# Patient Record
Sex: Male | Born: 1945 | Race: White | Hispanic: No
Health system: Southern US, Community
[De-identification: ages and names within clinical notes are randomized; demographics above are authoritative.]

## PROBLEM LIST (undated history)

## (undated) DIAGNOSIS — E876 Hypokalemia: Secondary | ICD-10-CM

## (undated) DIAGNOSIS — C329 Malignant neoplasm of larynx, unspecified: Secondary | ICD-10-CM

## (undated) DIAGNOSIS — D649 Anemia, unspecified: Secondary | ICD-10-CM

## (undated) DIAGNOSIS — E038 Other specified hypothyroidism: Secondary | ICD-10-CM

## (undated) DIAGNOSIS — I951 Orthostatic hypotension: Secondary | ICD-10-CM

## (undated) DIAGNOSIS — C189 Malignant neoplasm of colon, unspecified: Secondary | ICD-10-CM

## (undated) DIAGNOSIS — E785 Hyperlipidemia, unspecified: Secondary | ICD-10-CM

## (undated) HISTORY — DX: Orthostatic hypotension: I95.1

## (undated) HISTORY — DX: Anemia, unspecified: D64.9

## (undated) HISTORY — DX: Other specified hypothyroidism: E03.8

## (undated) HISTORY — DX: Hyperlipidemia, unspecified: E78.5

## (undated) HISTORY — DX: Hypokalemia: E87.6

---

## 1993-01-09 DIAGNOSIS — C189 Malignant neoplasm of colon, unspecified: Secondary | ICD-10-CM

## 1993-01-09 HISTORY — DX: Malignant neoplasm of colon, unspecified: C18.9

## 1997-11-30 ENCOUNTER — Ambulatory Visit (HOSPITAL_BASED_OUTPATIENT_CLINIC_OR_DEPARTMENT_OTHER): Admission: RE | Admit: 1997-11-30 | Discharge: 1997-11-30 | Payer: Self-pay | Admitting: Otolaryngology

## 1997-12-16 ENCOUNTER — Ambulatory Visit (HOSPITAL_BASED_OUTPATIENT_CLINIC_OR_DEPARTMENT_OTHER): Admission: RE | Admit: 1997-12-16 | Discharge: 1997-12-16 | Payer: Self-pay | Admitting: Otolaryngology

## 2006-01-12 ENCOUNTER — Encounter (HOSPITAL_COMMUNITY): Admission: RE | Admit: 2006-01-12 | Discharge: 2006-02-11 | Payer: Self-pay | Admitting: Oncology

## 2006-01-12 ENCOUNTER — Ambulatory Visit (HOSPITAL_COMMUNITY): Payer: Self-pay | Admitting: Oncology

## 2008-07-20 ENCOUNTER — Encounter: Admission: RE | Admit: 2008-07-20 | Discharge: 2008-10-05 | Payer: Self-pay | Admitting: Orthopedic Surgery

## 2008-12-29 ENCOUNTER — Encounter: Admission: RE | Admit: 2008-12-29 | Discharge: 2008-12-29 | Payer: Self-pay | Admitting: Otolaryngology

## 2009-01-11 ENCOUNTER — Ambulatory Visit (HOSPITAL_BASED_OUTPATIENT_CLINIC_OR_DEPARTMENT_OTHER): Admission: RE | Admit: 2009-01-11 | Discharge: 2009-01-11 | Payer: Self-pay | Admitting: Otolaryngology

## 2009-01-14 ENCOUNTER — Ambulatory Visit: Payer: Self-pay | Admitting: Oncology

## 2009-01-26 ENCOUNTER — Ambulatory Visit (HOSPITAL_COMMUNITY): Admission: RE | Admit: 2009-01-26 | Discharge: 2009-01-26 | Payer: Self-pay | Admitting: Otolaryngology

## 2009-02-01 ENCOUNTER — Ambulatory Visit
Admission: RE | Admit: 2009-02-01 | Discharge: 2009-03-25 | Payer: Self-pay | Source: Home / Self Care | Admitting: Radiation Oncology

## 2009-02-02 LAB — CBC WITH DIFFERENTIAL/PLATELET
BASO%: 0.3 % (ref 0.0–2.0)
LYMPH%: 19.4 % (ref 14.0–49.0)
MCHC: 33.6 g/dL (ref 32.0–36.0)
MONO#: 0.8 10*3/uL (ref 0.1–0.9)
RBC: 3.62 10*6/uL — ABNORMAL LOW (ref 4.20–5.82)
WBC: 10.8 10*3/uL — ABNORMAL HIGH (ref 4.0–10.3)
lymph#: 2.1 10*3/uL (ref 0.9–3.3)

## 2009-02-02 LAB — COMPREHENSIVE METABOLIC PANEL
ALT: 16 U/L (ref 0–53)
CO2: 28 mEq/L (ref 19–32)
Chloride: 105 mEq/L (ref 96–112)
Sodium: 141 mEq/L (ref 135–145)
Total Bilirubin: 0.4 mg/dL (ref 0.3–1.2)
Total Protein: 7.2 g/dL (ref 6.0–8.3)

## 2009-02-03 ENCOUNTER — Encounter: Admission: RE | Admit: 2009-02-03 | Discharge: 2009-02-03 | Payer: Self-pay | Admitting: Dentistry

## 2009-02-03 ENCOUNTER — Ambulatory Visit: Payer: Self-pay | Admitting: Dentistry

## 2009-02-05 ENCOUNTER — Ambulatory Visit (HOSPITAL_COMMUNITY): Admission: RE | Admit: 2009-02-05 | Discharge: 2009-02-05 | Payer: Self-pay | Admitting: Radiation Oncology

## 2009-03-08 ENCOUNTER — Encounter
Admission: RE | Admit: 2009-03-08 | Discharge: 2009-06-06 | Payer: Self-pay | Source: Home / Self Care | Admitting: Otolaryngology

## 2009-03-10 ENCOUNTER — Encounter (INDEPENDENT_AMBULATORY_CARE_PROVIDER_SITE_OTHER): Payer: Self-pay | Admitting: Otolaryngology

## 2009-03-10 ENCOUNTER — Inpatient Hospital Stay (HOSPITAL_COMMUNITY): Admission: RE | Admit: 2009-03-10 | Discharge: 2009-04-02 | Payer: Self-pay | Admitting: Otolaryngology

## 2009-03-25 ENCOUNTER — Ambulatory Visit: Payer: Self-pay | Admitting: Internal Medicine

## 2009-03-29 ENCOUNTER — Ambulatory Visit: Payer: Self-pay | Admitting: Oncology

## 2009-04-12 ENCOUNTER — Emergency Department (HOSPITAL_BASED_OUTPATIENT_CLINIC_OR_DEPARTMENT_OTHER): Admission: EM | Admit: 2009-04-12 | Discharge: 2009-04-13 | Payer: Self-pay | Admitting: Emergency Medicine

## 2009-04-13 ENCOUNTER — Ambulatory Visit: Payer: Self-pay | Admitting: Diagnostic Radiology

## 2009-06-01 ENCOUNTER — Emergency Department (HOSPITAL_COMMUNITY)
Admission: EM | Admit: 2009-06-01 | Discharge: 2009-06-01 | Payer: Self-pay | Source: Home / Self Care | Admitting: Emergency Medicine

## 2009-09-27 ENCOUNTER — Encounter: Admission: RE | Admit: 2009-09-27 | Discharge: 2009-09-27 | Payer: Self-pay | Admitting: Otolaryngology

## 2009-11-23 ENCOUNTER — Encounter: Admission: RE | Admit: 2009-11-23 | Discharge: 2009-11-23 | Payer: Self-pay | Admitting: Otolaryngology

## 2009-11-25 ENCOUNTER — Ambulatory Visit (HOSPITAL_COMMUNITY): Admission: RE | Admit: 2009-11-25 | Discharge: 2009-11-25 | Payer: Self-pay | Admitting: Otolaryngology

## 2009-11-25 ENCOUNTER — Ambulatory Visit
Admission: RE | Admit: 2009-11-25 | Discharge: 2010-02-08 | Payer: Self-pay | Source: Home / Self Care | Attending: Radiation Oncology | Admitting: Radiation Oncology

## 2009-11-28 ENCOUNTER — Emergency Department (HOSPITAL_COMMUNITY): Admission: EM | Admit: 2009-11-28 | Discharge: 2009-11-28 | Payer: Self-pay | Admitting: Emergency Medicine

## 2009-12-01 ENCOUNTER — Encounter (INDEPENDENT_AMBULATORY_CARE_PROVIDER_SITE_OTHER): Payer: Self-pay | Admitting: Otolaryngology

## 2009-12-01 ENCOUNTER — Inpatient Hospital Stay (HOSPITAL_COMMUNITY)
Admission: RE | Admit: 2009-12-01 | Discharge: 2009-12-04 | Payer: Self-pay | Source: Home / Self Care | Admitting: Otolaryngology

## 2009-12-10 ENCOUNTER — Encounter: Admission: RE | Admit: 2009-12-10 | Discharge: 2009-12-10 | Payer: Self-pay | Admitting: Otolaryngology

## 2009-12-16 ENCOUNTER — Ambulatory Visit: Payer: Self-pay | Admitting: Oncology

## 2009-12-17 LAB — COMPREHENSIVE METABOLIC PANEL
ALT: 16 U/L (ref 0–53)
AST: 17 U/L (ref 0–37)
CO2: 24 mEq/L (ref 19–32)
Calcium: 8.4 mg/dL (ref 8.4–10.5)
Chloride: 108 mEq/L (ref 96–112)
Creatinine, Ser: 0.97 mg/dL (ref 0.40–1.50)
Sodium: 141 mEq/L (ref 135–145)
Total Protein: 6.2 g/dL (ref 6.0–8.3)

## 2009-12-17 LAB — CBC WITH DIFFERENTIAL/PLATELET
BASO%: 0.1 % (ref 0.0–2.0)
Eosinophils Absolute: 0.1 10*3/uL (ref 0.0–0.5)
LYMPH%: 17.1 % (ref 14.0–49.0)
MONO#: 0.6 10*3/uL (ref 0.1–0.9)
NEUT#: 6.2 10*3/uL (ref 1.5–6.5)
Platelets: 433 10*3/uL — ABNORMAL HIGH (ref 140–400)
RBC: 3.26 10*6/uL — ABNORMAL LOW (ref 4.20–5.82)
RDW: 13.3 % (ref 11.0–14.6)
WBC: 8.3 10*3/uL (ref 4.0–10.3)
lymph#: 1.4 10*3/uL (ref 0.9–3.3)

## 2010-01-07 LAB — CBC WITH DIFFERENTIAL/PLATELET
Basophils Absolute: 0 10*3/uL (ref 0.0–0.1)
Eosinophils Absolute: 0.3 10*3/uL (ref 0.0–0.5)
HCT: 38.6 % (ref 38.4–49.9)
HGB: 12.9 g/dL — ABNORMAL LOW (ref 13.0–17.1)
NEUT#: 4.4 10*3/uL (ref 1.5–6.5)
NEUT%: 64 % (ref 39.0–75.0)
RDW: 13.7 % (ref 11.0–14.6)
lymph#: 1.6 10*3/uL (ref 0.9–3.3)

## 2010-01-07 LAB — COMPREHENSIVE METABOLIC PANEL
ALT: 16 U/L (ref 0–53)
Alkaline Phosphatase: 55 U/L (ref 39–117)
CO2: 26 mEq/L (ref 19–32)
Creatinine, Ser: 0.99 mg/dL (ref 0.40–1.50)
Glucose, Bld: 162 mg/dL — ABNORMAL HIGH (ref 70–99)
Sodium: 138 mEq/L (ref 135–145)
Total Bilirubin: 0.5 mg/dL (ref 0.3–1.2)

## 2010-01-07 LAB — MAGNESIUM: Magnesium: 2.2 mg/dL (ref 1.5–2.5)

## 2010-01-07 LAB — PHOSPHORUS: Phosphorus: 3.8 mg/dL (ref 2.3–4.6)

## 2010-01-14 LAB — COMPREHENSIVE METABOLIC PANEL
ALT: 20 U/L (ref 0–53)
AST: 25 U/L (ref 0–37)
Albumin: 3.7 g/dL (ref 3.5–5.2)
Alkaline Phosphatase: 68 U/L (ref 39–117)
BUN: 14 mg/dL (ref 6–23)
CO2: 28 mEq/L (ref 19–32)
Calcium: 9.2 mg/dL (ref 8.4–10.5)
Chloride: 98 mEq/L (ref 96–112)
Creatinine, Ser: 1.07 mg/dL (ref 0.40–1.50)
Glucose, Bld: 93 mg/dL (ref 70–99)
Potassium: 4.4 mEq/L (ref 3.5–5.3)
Sodium: 135 mEq/L (ref 135–145)
Total Bilirubin: 0.7 mg/dL (ref 0.3–1.2)
Total Protein: 7.6 g/dL (ref 6.0–8.3)

## 2010-01-14 LAB — MAGNESIUM: Magnesium: 2.3 mg/dL (ref 1.5–2.5)

## 2010-01-14 LAB — CBC WITH DIFFERENTIAL/PLATELET
BASO%: 0.4 % (ref 0.0–2.0)
Basophils Absolute: 0 10*3/uL (ref 0.0–0.1)
EOS%: 1.7 % (ref 0.0–7.0)
Eosinophils Absolute: 0.2 10*3/uL (ref 0.0–0.5)
HCT: 34.7 % — ABNORMAL LOW (ref 38.4–49.9)
HGB: 12 g/dL — ABNORMAL LOW (ref 13.0–17.1)
LYMPH%: 12.8 % — ABNORMAL LOW (ref 14.0–49.0)
MCH: 35.1 pg — ABNORMAL HIGH (ref 27.2–33.4)
MCHC: 34.6 g/dL (ref 32.0–36.0)
MCV: 101.5 fL — ABNORMAL HIGH (ref 79.3–98.0)
MONO#: 1.5 10*3/uL — ABNORMAL HIGH (ref 0.1–0.9)
MONO%: 13.8 % (ref 0.0–14.0)
NEUT#: 7.5 10*3/uL — ABNORMAL HIGH (ref 1.5–6.5)
NEUT%: 71.3 % (ref 39.0–75.0)
Platelets: 283 10*3/uL (ref 140–400)
RBC: 3.42 10*6/uL — ABNORMAL LOW (ref 4.20–5.82)
RDW: 13.4 % (ref 11.0–14.6)
WBC: 10.6 10*3/uL — ABNORMAL HIGH (ref 4.0–10.3)
lymph#: 1.4 10*3/uL (ref 0.9–3.3)

## 2010-01-19 ENCOUNTER — Ambulatory Visit (HOSPITAL_BASED_OUTPATIENT_CLINIC_OR_DEPARTMENT_OTHER): Payer: MEDICARE | Admitting: Oncology

## 2010-01-21 LAB — CBC WITH DIFFERENTIAL/PLATELET
BASO%: 1.1 % (ref 0.0–2.0)
Basophils Absolute: 0.1 10*3/uL (ref 0.0–0.1)
EOS%: 1 % (ref 0.0–7.0)
Eosinophils Absolute: 0.1 10*3/uL (ref 0.0–0.5)
HCT: 34.7 % — ABNORMAL LOW (ref 38.4–49.9)
HGB: 11.9 g/dL — ABNORMAL LOW (ref 13.0–17.1)
LYMPH%: 17.2 % (ref 14.0–49.0)
MCH: 34.9 pg — ABNORMAL HIGH (ref 27.2–33.4)
MCHC: 34.3 g/dL (ref 32.0–36.0)
MCV: 101.8 fL — ABNORMAL HIGH (ref 79.3–98.0)
MONO#: 0.9 10*3/uL (ref 0.1–0.9)
MONO%: 12.4 % (ref 0.0–14.0)
NEUT#: 4.8 10*3/uL (ref 1.5–6.5)
NEUT%: 68.3 % (ref 39.0–75.0)
Platelets: 215 10*3/uL (ref 140–400)
RBC: 3.41 10*6/uL — ABNORMAL LOW (ref 4.20–5.82)
RDW: 13.8 % (ref 11.0–14.6)
WBC: 7 10*3/uL (ref 4.0–10.3)
lymph#: 1.2 10*3/uL (ref 0.9–3.3)
nRBC: 0 % (ref 0–0)

## 2010-01-21 LAB — COMPREHENSIVE METABOLIC PANEL
ALT: 23 U/L (ref 0–53)
AST: 25 U/L (ref 0–37)
Albumin: 3.4 g/dL — ABNORMAL LOW (ref 3.5–5.2)
Alkaline Phosphatase: 60 U/L (ref 39–117)
BUN: 17 mg/dL (ref 6–23)
CO2: 29 mEq/L (ref 19–32)
Calcium: 8.7 mg/dL (ref 8.4–10.5)
Chloride: 104 mEq/L (ref 96–112)
Creatinine, Ser: 0.99 mg/dL (ref 0.40–1.50)
Glucose, Bld: 90 mg/dL (ref 70–99)
Potassium: 4.7 mEq/L (ref 3.5–5.3)
Sodium: 140 mEq/L (ref 135–145)
Total Bilirubin: 0.5 mg/dL (ref 0.3–1.2)
Total Protein: 6.5 g/dL (ref 6.0–8.3)

## 2010-01-21 LAB — MAGNESIUM: Magnesium: 2.1 mg/dL (ref 1.5–2.5)

## 2010-01-27 LAB — CBC WITH DIFFERENTIAL/PLATELET
BASO%: 0 % (ref 0.0–2.0)
Basophils Absolute: 0 10*3/uL (ref 0.0–0.1)
EOS%: 0.5 % (ref 0.0–7.0)
Eosinophils Absolute: 0 10*3/uL (ref 0.0–0.5)
HCT: 34.9 % — ABNORMAL LOW (ref 38.4–49.9)
HGB: 12.1 g/dL — ABNORMAL LOW (ref 13.0–17.1)
LYMPH%: 9 % — ABNORMAL LOW (ref 14.0–49.0)
MCH: 35.9 pg — ABNORMAL HIGH (ref 27.2–33.4)
MCHC: 34.6 g/dL (ref 32.0–36.0)
MCV: 103.9 fL — ABNORMAL HIGH (ref 79.3–98.0)
MONO#: 0.5 10*3/uL (ref 0.1–0.9)
MONO%: 10.2 % (ref 0.0–14.0)
NEUT#: 4.3 10*3/uL (ref 1.5–6.5)
NEUT%: 80.3 % — ABNORMAL HIGH (ref 39.0–75.0)
Platelets: 260 10*3/uL (ref 140–400)
RBC: 3.36 10*6/uL — ABNORMAL LOW (ref 4.20–5.82)
RDW: 15.1 % — ABNORMAL HIGH (ref 11.0–14.6)
WBC: 5.3 10*3/uL (ref 4.0–10.3)
lymph#: 0.5 10*3/uL — ABNORMAL LOW (ref 0.9–3.3)

## 2010-01-27 LAB — COMPREHENSIVE METABOLIC PANEL
ALT: 26 U/L (ref 0–53)
AST: 23 U/L (ref 0–37)
Albumin: 3.5 g/dL (ref 3.5–5.2)
Alkaline Phosphatase: 61 U/L (ref 39–117)
BUN: 17 mg/dL (ref 6–23)
CO2: 27 mEq/L (ref 19–32)
Calcium: 9.2 mg/dL (ref 8.4–10.5)
Chloride: 104 mEq/L (ref 96–112)
Creatinine, Ser: 1.06 mg/dL (ref 0.40–1.50)
Glucose, Bld: 103 mg/dL — ABNORMAL HIGH (ref 70–99)
Potassium: 4.1 mEq/L (ref 3.5–5.3)
Sodium: 138 mEq/L (ref 135–145)
Total Bilirubin: 0.7 mg/dL (ref 0.3–1.2)
Total Protein: 7 g/dL (ref 6.0–8.3)

## 2010-01-27 LAB — MAGNESIUM: Magnesium: 2 mg/dL (ref 1.5–2.5)

## 2010-01-30 ENCOUNTER — Encounter: Payer: Self-pay | Admitting: Oncology

## 2010-02-04 LAB — CBC WITH DIFFERENTIAL/PLATELET
Basophils Absolute: 0 10*3/uL (ref 0.0–0.1)
Eosinophils Absolute: 0 10*3/uL (ref 0.0–0.5)
HCT: 31.3 % — ABNORMAL LOW (ref 38.4–49.9)
HGB: 10.5 g/dL — ABNORMAL LOW (ref 13.0–17.1)
MCV: 101.6 fL — ABNORMAL HIGH (ref 79.3–98.0)
MONO%: 16.8 % — ABNORMAL HIGH (ref 0.0–14.0)
NEUT#: 2.6 10*3/uL (ref 1.5–6.5)
RDW: 13.6 % (ref 11.0–14.6)
lymph#: 0.4 10*3/uL — ABNORMAL LOW (ref 0.9–3.3)

## 2010-02-04 LAB — COMPREHENSIVE METABOLIC PANEL
Albumin: 3.6 g/dL (ref 3.5–5.2)
CO2: 27 mEq/L (ref 19–32)
Glucose, Bld: 86 mg/dL (ref 70–99)
Potassium: 4.4 mEq/L (ref 3.5–5.3)
Sodium: 138 mEq/L (ref 135–145)
Total Protein: 7.1 g/dL (ref 6.0–8.3)

## 2010-02-04 LAB — MAGNESIUM: Magnesium: 2 mg/dL (ref 1.5–2.5)

## 2010-02-07 ENCOUNTER — Other Ambulatory Visit: Payer: Self-pay | Admitting: Radiation Oncology

## 2010-02-09 ENCOUNTER — Ambulatory Visit: Payer: MEDICARE | Attending: Radiation Oncology | Admitting: Radiation Oncology

## 2010-02-09 DIAGNOSIS — R5381 Other malaise: Secondary | ICD-10-CM | POA: Insufficient documentation

## 2010-02-09 DIAGNOSIS — K1230 Oral mucositis (ulcerative), unspecified: Secondary | ICD-10-CM | POA: Insufficient documentation

## 2010-02-09 DIAGNOSIS — K121 Other forms of stomatitis: Secondary | ICD-10-CM | POA: Insufficient documentation

## 2010-02-09 DIAGNOSIS — D759 Disease of blood and blood-forming organs, unspecified: Secondary | ICD-10-CM | POA: Insufficient documentation

## 2010-02-09 DIAGNOSIS — E039 Hypothyroidism, unspecified: Secondary | ICD-10-CM | POA: Insufficient documentation

## 2010-02-09 DIAGNOSIS — R634 Abnormal weight loss: Secondary | ICD-10-CM | POA: Insufficient documentation

## 2010-02-09 DIAGNOSIS — C329 Malignant neoplasm of larynx, unspecified: Secondary | ICD-10-CM | POA: Insufficient documentation

## 2010-02-09 DIAGNOSIS — K219 Gastro-esophageal reflux disease without esophagitis: Secondary | ICD-10-CM | POA: Insufficient documentation

## 2010-02-09 DIAGNOSIS — E876 Hypokalemia: Secondary | ICD-10-CM | POA: Insufficient documentation

## 2010-02-09 DIAGNOSIS — Z7982 Long term (current) use of aspirin: Secondary | ICD-10-CM | POA: Insufficient documentation

## 2010-02-09 DIAGNOSIS — I951 Orthostatic hypotension: Secondary | ICD-10-CM | POA: Insufficient documentation

## 2010-02-09 DIAGNOSIS — Z510 Encounter for antineoplastic radiation therapy: Secondary | ICD-10-CM | POA: Insufficient documentation

## 2010-02-09 DIAGNOSIS — M549 Dorsalgia, unspecified: Secondary | ICD-10-CM | POA: Insufficient documentation

## 2010-02-09 DIAGNOSIS — Z79899 Other long term (current) drug therapy: Secondary | ICD-10-CM | POA: Insufficient documentation

## 2010-02-09 DIAGNOSIS — Z8701 Personal history of pneumonia (recurrent): Secondary | ICD-10-CM | POA: Insufficient documentation

## 2010-02-11 ENCOUNTER — Encounter (HOSPITAL_BASED_OUTPATIENT_CLINIC_OR_DEPARTMENT_OTHER): Payer: MEDICARE | Admitting: Oncology

## 2010-02-11 DIAGNOSIS — E039 Hypothyroidism, unspecified: Secondary | ICD-10-CM

## 2010-02-11 DIAGNOSIS — K121 Other forms of stomatitis: Secondary | ICD-10-CM

## 2010-02-11 DIAGNOSIS — I10 Essential (primary) hypertension: Secondary | ICD-10-CM

## 2010-02-11 DIAGNOSIS — C329 Malignant neoplasm of larynx, unspecified: Secondary | ICD-10-CM

## 2010-02-11 DIAGNOSIS — R197 Diarrhea, unspecified: Secondary | ICD-10-CM

## 2010-02-11 DIAGNOSIS — Z5111 Encounter for antineoplastic chemotherapy: Secondary | ICD-10-CM

## 2010-02-11 DIAGNOSIS — K219 Gastro-esophageal reflux disease without esophagitis: Secondary | ICD-10-CM

## 2010-02-11 LAB — COMPREHENSIVE METABOLIC PANEL
ALT: 21 U/L (ref 0–53)
AST: 24 U/L (ref 0–37)
Alkaline Phosphatase: 62 U/L (ref 39–117)
CO2: 26 mEq/L (ref 19–32)
Sodium: 137 mEq/L (ref 135–145)
Total Bilirubin: 0.5 mg/dL (ref 0.3–1.2)
Total Protein: 6.7 g/dL (ref 6.0–8.3)

## 2010-02-11 LAB — CBC WITH DIFFERENTIAL/PLATELET
BASO%: 0.7 % (ref 0.0–2.0)
EOS%: 1.4 % (ref 0.0–7.0)
LYMPH%: 23.4 % (ref 14.0–49.0)
MCHC: 33.8 g/dL (ref 32.0–36.0)
MONO#: 0.4 10*3/uL (ref 0.1–0.9)
MONO%: 14.2 % — ABNORMAL HIGH (ref 0.0–14.0)
Platelets: 150 10*3/uL (ref 140–400)
RBC: 2.98 10*6/uL — ABNORMAL LOW (ref 4.20–5.82)
WBC: 3 10*3/uL — ABNORMAL LOW (ref 4.0–10.3)

## 2010-02-15 ENCOUNTER — Other Ambulatory Visit (HOSPITAL_COMMUNITY): Payer: MEDICARE

## 2010-02-15 ENCOUNTER — Inpatient Hospital Stay (HOSPITAL_COMMUNITY)
Admission: EM | Admit: 2010-02-15 | Discharge: 2010-02-19 | DRG: 195 | Disposition: A | Discharge status: Home Health | Payer: MEDICARE | Attending: Internal Medicine | Admitting: Internal Medicine

## 2010-02-15 ENCOUNTER — Ambulatory Visit (HOSPITAL_COMMUNITY): Payer: MEDICARE

## 2010-02-15 ENCOUNTER — Emergency Department (HOSPITAL_COMMUNITY): Payer: MEDICARE

## 2010-02-15 DIAGNOSIS — Z8614 Personal history of Methicillin resistant Staphylococcus aureus infection: Secondary | ICD-10-CM

## 2010-02-15 DIAGNOSIS — I2510 Atherosclerotic heart disease of native coronary artery without angina pectoris: Secondary | ICD-10-CM | POA: Diagnosis present

## 2010-02-15 DIAGNOSIS — D709 Neutropenia, unspecified: Secondary | ICD-10-CM | POA: Diagnosis present

## 2010-02-15 DIAGNOSIS — T451X5A Adverse effect of antineoplastic and immunosuppressive drugs, initial encounter: Secondary | ICD-10-CM | POA: Diagnosis present

## 2010-02-15 DIAGNOSIS — I10 Essential (primary) hypertension: Secondary | ICD-10-CM | POA: Diagnosis present

## 2010-02-15 DIAGNOSIS — Z85038 Personal history of other malignant neoplasm of large intestine: Secondary | ICD-10-CM

## 2010-02-15 DIAGNOSIS — Z87891 Personal history of nicotine dependence: Secondary | ICD-10-CM

## 2010-02-15 DIAGNOSIS — Z79899 Other long term (current) drug therapy: Secondary | ICD-10-CM

## 2010-02-15 DIAGNOSIS — E039 Hypothyroidism, unspecified: Secondary | ICD-10-CM | POA: Diagnosis present

## 2010-02-15 DIAGNOSIS — Z9861 Coronary angioplasty status: Secondary | ICD-10-CM

## 2010-02-15 DIAGNOSIS — D702 Other drug-induced agranulocytosis: Secondary | ICD-10-CM | POA: Diagnosis present

## 2010-02-15 DIAGNOSIS — C329 Malignant neoplasm of larynx, unspecified: Secondary | ICD-10-CM | POA: Diagnosis present

## 2010-02-15 DIAGNOSIS — J449 Chronic obstructive pulmonary disease, unspecified: Secondary | ICD-10-CM | POA: Diagnosis present

## 2010-02-15 DIAGNOSIS — J189 Pneumonia, unspecified organism: Principal | ICD-10-CM | POA: Diagnosis present

## 2010-02-15 DIAGNOSIS — J4489 Other specified chronic obstructive pulmonary disease: Secondary | ICD-10-CM | POA: Diagnosis present

## 2010-02-15 DIAGNOSIS — D6481 Anemia due to antineoplastic chemotherapy: Secondary | ICD-10-CM | POA: Diagnosis present

## 2010-02-15 DIAGNOSIS — E785 Hyperlipidemia, unspecified: Secondary | ICD-10-CM | POA: Diagnosis present

## 2010-02-15 LAB — COMPREHENSIVE METABOLIC PANEL
Albumin: 3.5 g/dL (ref 3.5–5.2)
Alkaline Phosphatase: 62 U/L (ref 39–117)
BUN: 21 mg/dL (ref 6–23)
Calcium: 9.1 mg/dL (ref 8.4–10.5)
Creatinine, Ser: 1.52 mg/dL — ABNORMAL HIGH (ref 0.4–1.5)
Glucose, Bld: 104 mg/dL — ABNORMAL HIGH (ref 70–99)
Potassium: 4.7 mEq/L (ref 3.5–5.1)
Total Protein: 6.7 g/dL (ref 6.0–8.3)

## 2010-02-15 LAB — CBC
HCT: 26.9 % — ABNORMAL LOW (ref 39.0–52.0)
Hemoglobin: 9.2 g/dL — ABNORMAL LOW (ref 13.0–17.0)
MCHC: 34.2 g/dL (ref 30.0–36.0)
MCV: 101.1 fL — ABNORMAL HIGH (ref 78.0–100.0)
RDW: 14.1 % (ref 11.5–15.5)
WBC: 2.3 10*3/uL — ABNORMAL LOW (ref 4.0–10.5)

## 2010-02-15 LAB — URINALYSIS, ROUTINE W REFLEX MICROSCOPIC
Bilirubin Urine: NEGATIVE
Hgb urine dipstick: NEGATIVE
Ketones, ur: NEGATIVE mg/dL
Urine Glucose, Fasting: NEGATIVE mg/dL
pH: 7 (ref 5.0–8.0)

## 2010-02-15 LAB — DIFFERENTIAL
Eosinophils Relative: 1 % (ref 0–5)
Lymphocytes Relative: 16 % (ref 12–46)
Lymphs Abs: 0.4 10*3/uL — ABNORMAL LOW (ref 0.7–4.0)
Monocytes Absolute: 0.4 10*3/uL (ref 0.1–1.0)
Neutro Abs: 1.5 10*3/uL — ABNORMAL LOW (ref 1.7–7.7)

## 2010-02-15 LAB — OCCULT BLOOD, POC DEVICE: Fecal Occult Bld: NEGATIVE

## 2010-02-15 LAB — MRSA PCR SCREENING: MRSA by PCR: POSITIVE — AB

## 2010-02-16 DIAGNOSIS — J189 Pneumonia, unspecified organism: Secondary | ICD-10-CM

## 2010-02-16 LAB — CBC
HCT: 22.9 % — ABNORMAL LOW (ref 39.0–52.0)
Platelets: 154 10*3/uL (ref 150–400)
RBC: 2.3 MIL/uL — ABNORMAL LOW (ref 4.22–5.81)
RDW: 13.9 % (ref 11.5–15.5)
WBC: 1.9 10*3/uL — ABNORMAL LOW (ref 4.0–10.5)

## 2010-02-16 LAB — DIFFERENTIAL
Basophils Absolute: 0 10*3/uL (ref 0.0–0.1)
Eosinophils Relative: 2 % (ref 0–5)
Lymphocytes Relative: 12 % (ref 12–46)
Lymphs Abs: 0.2 10*3/uL — ABNORMAL LOW (ref 0.7–4.0)
Neutrophils Relative %: 56 % (ref 43–77)

## 2010-02-16 LAB — COMPREHENSIVE METABOLIC PANEL
ALT: 13 U/L (ref 0–53)
Albumin: 2.6 g/dL — ABNORMAL LOW (ref 3.5–5.2)
Alkaline Phosphatase: 51 U/L (ref 39–117)
Chloride: 106 mEq/L (ref 96–112)
Glucose, Bld: 121 mg/dL — ABNORMAL HIGH (ref 70–99)
Potassium: 4.6 mEq/L (ref 3.5–5.1)
Sodium: 138 mEq/L (ref 135–145)
Total Bilirubin: 0.5 mg/dL (ref 0.3–1.2)
Total Protein: 5.6 g/dL — ABNORMAL LOW (ref 6.0–8.3)

## 2010-02-17 ENCOUNTER — Encounter (HOSPITAL_COMMUNITY): Payer: MEDICARE

## 2010-02-17 LAB — RETICULOCYTES: Retic Count, Absolute: 9 10*3/uL — ABNORMAL LOW (ref 19.0–186.0)

## 2010-02-17 LAB — CBC
HCT: 22.6 % — ABNORMAL LOW (ref 39.0–52.0)
Hemoglobin: 7.9 g/dL — ABNORMAL LOW (ref 13.0–17.0)
MCV: 100.4 fL — ABNORMAL HIGH (ref 78.0–100.0)
Platelets: 176 10*3/uL (ref 150–400)
RBC: 2.25 MIL/uL — ABNORMAL LOW (ref 4.22–5.81)
WBC: 2.1 10*3/uL — ABNORMAL LOW (ref 4.0–10.5)

## 2010-02-17 LAB — DIFFERENTIAL
Lymphocytes Relative: 13 % (ref 12–46)
Lymphs Abs: 0.3 10*3/uL — ABNORMAL LOW (ref 0.7–4.0)
Neutro Abs: 1.1 10*3/uL — ABNORMAL LOW (ref 1.7–7.7)
Neutrophils Relative %: 54 % (ref 43–77)

## 2010-02-17 LAB — ABO/RH: ABO/RH(D): A NEG

## 2010-02-17 LAB — LACTATE DEHYDROGENASE: LDH: 102 U/L (ref 94–250)

## 2010-02-18 ENCOUNTER — Inpatient Hospital Stay (HOSPITAL_COMMUNITY): Payer: MEDICARE

## 2010-02-18 LAB — COMPREHENSIVE METABOLIC PANEL
ALT: 9 U/L (ref 0–53)
AST: 14 U/L (ref 0–37)
Albumin: 2.7 g/dL — ABNORMAL LOW (ref 3.5–5.2)
Alkaline Phosphatase: 46 U/L (ref 39–117)
GFR calc Af Amer: >60 mL/min (ref >60)
Potassium: 4.3 mEq/L (ref 3.5–5.1)
Sodium: 140 mEq/L (ref 135–145)
Total Protein: 5.5 g/dL — ABNORMAL LOW (ref 6.0–8.3)

## 2010-02-18 LAB — CBC
MCV: 97.1 fL (ref 78.0–100.0)
Platelets: 169 10*3/uL (ref 150–400)
RBC: 2.44 MIL/uL — ABNORMAL LOW (ref 4.22–5.81)
WBC: 2.4 10*3/uL — ABNORMAL LOW (ref 4.0–10.5)

## 2010-02-18 LAB — CROSSMATCH

## 2010-02-18 LAB — DIFFERENTIAL
Basophils Relative: 0 % (ref 0–1)
Eosinophils Absolute: 0 10*3/uL (ref 0.0–0.7)
Lymphs Abs: 0.3 10*3/uL — ABNORMAL LOW (ref 0.7–4.0)
Neutrophils Relative %: 65 % (ref 43–77)

## 2010-02-18 LAB — CULTURE, RESPIRATORY W GRAM STAIN

## 2010-02-18 LAB — VANCOMYCIN, TROUGH: Vancomycin Tr: 13 ug/mL (ref 10.0–20.0)

## 2010-02-19 LAB — CBC
HCT: 24.9 % — ABNORMAL LOW (ref 39.0–52.0)
MCH: 34.2 pg — ABNORMAL HIGH (ref 26.0–34.0)
MCV: 96.9 fL (ref 78.0–100.0)
Platelets: 180 10*3/uL (ref 150–400)
RDW: 14.9 % (ref 11.5–15.5)

## 2010-02-19 LAB — DIFFERENTIAL
Eosinophils Absolute: 0 10*3/uL (ref 0.0–0.7)
Eosinophils Relative: 1 % (ref 0–5)
Lymphs Abs: 0.6 10*3/uL — ABNORMAL LOW (ref 0.7–4.0)
Monocytes Relative: 18 % — ABNORMAL HIGH (ref 3–12)

## 2010-02-19 LAB — COMPREHENSIVE METABOLIC PANEL
Albumin: 2.5 g/dL — ABNORMAL LOW (ref 3.5–5.2)
BUN: 7 mg/dL (ref 6–23)
Chloride: 107 mEq/L (ref 96–112)
Creatinine, Ser: 1.29 mg/dL (ref 0.4–1.5)
Glucose, Bld: 105 mg/dL — ABNORMAL HIGH (ref 70–99)
Total Bilirubin: 0.5 mg/dL (ref 0.3–1.2)

## 2010-02-19 LAB — MAGNESIUM: Magnesium: 1.6 mg/dL (ref 1.5–2.5)

## 2010-02-20 NOTE — Discharge Summary (Signed)
[name], [name]                ACCOUNT NO.:  [id number]  MEDICAL RECORD NO.:  [id number]           PATIENT TYPE:  I  LOCATION:  1429                         FACILITY:  [facility]  PHYSICIAN:  Talmage Nap, MD  DATE OF BIRTH:  Feb 26, 1945  DATE OF ADMISSION:  02/15/2010 DATE OF DISCHARGE:  02/19/2010                        DISCHARGE SUMMARY - REFERRING   PRIMARY CARE PHYSICIAN/ONC:  Jethro Bolus, M.D.  DISCHARGE DIAGNOSES: 1. Questionable right lower lobe pneumonia. 2. Neutropenia. 3. Anemia, status post packed red blood cells transfusion. 4. History of laryngeal carcinoma, status post laryngectomy. 5. Coronary artery disease, status post stent. 6. Hypertension. 7. Hypothyroidism. 8. Hyperlipidemia.  The patient is a 65 year old Caucasian male with history of laryngeal CA, status post total laryngectomy with neck dissection and also status post radio chemotherapy, was admitted to the hospital on February 15, 2010, by Dr. Arne Cleveland with 1-day history of fever and chills.  The patient was also said to have had cough that was productive of sputum. There was no history of chest pain.  There was no history of nausea or vomiting, and subsequently, the patient was admitted for evaluation and stabilization.  PREADMISSION MEDICATIONS: 1. Carafate 2 teaspoons 4 times a day. 2. Aristocort topical apply twice a day to affected area. 3. Cetaphil lotion topical, apply daily p.r.n. 4. Chemotherapy regimen. 5. Radiotherapy regimen. 6. Fentanyl patch 25 mcg q.72h. transdermal. 7. Fluconazole 100 mg 1-2 tablets p.o. daily. 8. Levothyroxine 125 mcg p.o. daily. 9. Viscous lidocaine. 10.Benadryl and Maalox suspension 2 teaspoonfuls for 4 times a day     p.r.n. for radiation laryngitis. 11.Phenergan 10 mg 1 tablet p.o. q.6h. p.r.n. 12.Zofran 4 mg 1 p.o. q.12h. p.r.n. 13.Nexium 40 mg p.o. daily. 14.Hydromorphone 4 mg 1 tablet t.i.d. p.r.n. 15.Lorazepam 1 mg 1 tablet daily before  radiation.  ALLERGIES:  No known allergies.  PAST SURGICAL HISTORY: 1. Laryngeal CA, status post microlaryngoscopy, status post     laryngectomy with neck dissection. 2. History of colon CA, status post exploratory laparotomy with     resection. 3. Coronary artery disease, status post stent.  SOCIAL HISTORY:  The patient lives with his family.  Denies any history of alcohol or tobacco use.  FAMILY HISTORY:  [place] Spaniel to be noncontributory.  REVIEW OF SYSTEMS:  Essentially documented in the initial history and physical.  PHYSICAL EXAMINATION:  GENERAL:  At the time the patient was seen by the admitting physician, he was said not to be in any distress. VITAL SIGNS:  T-max 102, blood pressure is 95/50, pulse 93, and respiratory rate 18. HEENT:  Pupils were reactive to light, and extraocular muscles are intact.  Conjunctivae pale. NECK:  Laryngeal ostium. CHEST:  Said to have showed scattered rhonchi. HEART:  Sounds are S1-S2. ABDOMEN:  Soft and nontender.  Liver, spleen, and kidney not palpable. Bowel sounds are positive. EXTREMITIES:  No pedal edema. NEUROLOGIC:  Exam was nonfocal. MUSCULOSKELETAL SYSTEM:  Unremarkable. SKIN:  Normal turgor.  LABORATORY DATA:  Initial urinalysis done on the patient was negative. Complete blood count with differential showed WBC of 2.3, hemoglobin 9.2, hematocrit 26.9, MCV 101.1 with a platelet count  of 176,000; neutrophils 64, monocytes 18.  Comprehensive metabolic panel showed sodium of 138, potassium of 4.7, chloride of 105 with a bicarb of 27, glucose is 104, BUN is 21, and creatinine is 1.52.  Fecal occult blood test negative.  Routine MRSA screening was positive.  A repeat complete blood count with differential done on February 16, 2010, showed WBC of 1.9, hemoglobin of 8.0, hematocrit of 22.9, MCV of 99.6 with a platelet count of 154,000.  Then, comprehensive metabolic panel done on February 16, 2010, showed sodium of 138, potassium of  4.6, chloride of 106 with a bicarb of 26, glucose is 121, BUN is 18, and creatinine is 1.39.  Blood cultures x2 negative.  Reticulocyte count is 0.4%.  LDH is 102, normal. Sputum culture grew moderate growth of MRSA.  Repeat complete blood count with differential done on February 19, 2010, showed WBC of 2.7, hemoglobin of 8.8, hematocrit of 24.9, MCV of 96.9 with a platelet count of 180,000; monocytes 18%; and comprehensive metabolic panel showed sodium of 139, potassium of 4.4, chloride of 107 with bicarb of 26, glucose is 105, BUN is 7, creatinine is 1.29, and magnesium level is at 1.6.  IMAGING STUDIES:  Done include chest x-ray on admission which showed increased opacification in the right lung base, questionable airspace disease and a repeat chest x-ray done on February 18, 2010, showed improved aeration in the right lung base.  HOSPITAL COURSE:  The patient was admitted to regular floor, was started on half-normal saline to go at rate of 50 mL an hour.  He was also given albuterol and Atrovent nebs and Lovenox 40 mg subcutaneous q.24h. for DVT prophylaxis and Zofran for nausea.  She was treated for pneumonia with IV vancomycin, Zosyn, and Cipro and dosing was done by the pharmacist.  Also given to the patient was Neosporin ointment to be applied to the stoma around the neck twice a day.  The patient was evaluated by me for the very first time on February 17, 2010, and examination showed the patient to be pale.  He was subsequently typed and crossed and transfused with 1 unit of packed rbc's which he tolerated.  In this admission, the patient was also seen by the oncologist, Dr. Jethro Bolus, and basically no additional recommendations were made.  In addition, the patient was also put on dysphagia diet which he tolerated.  The patient also had PT and OT done in this admission.  Again, he tolerated that.  So far, he has remained clinically stable.  He was seen and evaluated by me today.   Denies any complaint.  Examination showed clear lungs and a followup chest x-ray showed remarkably improved aeration in the right lung base.  Clinically stable.  His vital signs today; blood pressure is at 118/68, temperature is 98.3 pulses 84, respiratory 20, and medically stable.  Plan is for the patient to be discharged home today on activity as tolerated and low- sodium, low-cholesterol diet.  Follow up with his primary care doctor in 1-2 weeks and also with the scheduled appointment with the oncologist.  MEDICATIONS TO BE TAKEN AT HOME: 1. Aquacel ointment apply topically b.i.d. p.r.n. 2. Levaquin 500 mg 1 p.o. daily for the next 7 days. 3. Mupirocin 2% ointment, that is Bactroban apply b.i.d. 4. Triamcinolone acetonide apply topically b.i.d. 5. Aristocort apply topically b.i.d. 6. Carafate, that is sucralfate 1 g/10 mL 2 teaspoonfuls 4 times a     day. 7. Cetaphil lotion apply b.i.d. to dry  skin. 8. The patient to continue with his chemotherapy regimen. 9. Fentanyl patch 25 mcg per hour transdermal q.72h. 10.Fluconazole 100 mg 1-2 tablets for the next 20 days. 11.Hydromorphone oral 4 mg 1 p.o. t.i.d. p.r.n. 12.Levothyroxine 125 mcg 1 p.o. q.a.m. 13.Lidocaine 2%/Benadryl/Maalox suspension 2 teaspoonfuls q.i.d. with     meals. 14.Lorazepam 1 mg p.o. q.a.m. before radiation. 15.Nexium (omeprazole) 40 mg 1 p.o. daily p.r.n. 16.Ondansetron 4 mg underneath the tongue q.12h. 17.Prochlorperazine 10 mg 1 p.o. q.6h.     Talmage Nap, MD     CN/MEDQ  D:  02/19/2010  T:  02/19/2010  Job:  [id number]  cc:   Jethro Bolus, MD  Electronically Signed by Talmage Nap  on 02/19/2010 03:16:54 PM

## 2010-02-21 LAB — CULTURE, BLOOD (ROUTINE X 2): Culture  Setup Time: 201202072059

## 2010-02-25 NOTE — H&P (Signed)
[name], [name]                ACCOUNT NO.:  [id number]  MEDICAL RECORD NO.:  [id number]           PATIENT TYPE:  I  LOCATION:  1429                         FACILITY:  [facility]  PHYSICIAN:  Arne Cleveland, MD       DATE OF BIRTH:  12-Jul-1945  DATE OF ADMISSION:  02/15/2010 DATE OF DISCHARGE:  02/15/2010                             HISTORY & PHYSICAL   PRIMARY CARE PHYSICIAN/REFERRING PHYSICIAN:  Jethro Bolus, MD  CHIEF COMPLAINT:  Fever, chills of 1 day duration.  HISTORY OF PRESENT ILLNESS:  [name] is a 65 year old Caucasian male who is being treated with radiation and chemotherapy for laryngeal cancer.  He apparently had a total laryngectomy and neck dissection in March 2011 and Thanksgiving.  He did not go to chemotherapy or radiation post that surgery because he was too weak.  At Thanksgiving, a lump came up in the left neck area and he had surgery for that and then has been receiving chemo and radiation which is a 6-week course.  The patient had been doing well until today when he post radiation developed fever, chills and productive cough.  PAST MEDICAL HISTORY:  Significant for laryngeal cancer 12 years ago which was treated with microlaryngoscopy and laser treatment.  The patient continued to smoke and developed laryngeal cancer in 2011 and had a total laryngectomy and neck dissection at that time.  He has also history of colon cancer and had surgery for that 15 years ago and has been cancer free from that.  He also has hypothyroidism, chronic obstructive pulmonary disease, hypertension, hyperlipidemia as well as coronary artery disease.  PAST SURGICAL HISTORY:  He had microlaryngoscopy and laser treatment for laryngeal cancer 12 years ago and had remained cancer free until February of 2011 when he was diagnosed with laryngeal cancer recurrence and had total laryngectomy and neck dissection.  He had colon cancer resection 15 years ago and he has had a cardiac stent done  for coronary artery disease.  REVIEW OF SYSTEMS:  The patient had been doing well until day of admission when post radiation on the way home developed fever and chills and cough that was his only symptom.  Review of systems was negative for any new findings.  All systems were reviewed.  MEDICATIONS: 1. The patient takes Carafate 2 teaspoons 4 times a day. 2. Aristocort topical applied twice daily to affected area. 3. Cetaphil lotion topical applied twice daily as needed. 4. He is on a chemotherapy and radiation regimen. 5. Bentyl patch 25 mcg per hour every 3 days. 6. Fluconazole 100 mg 1 or 2 tablets daily. 7. Levothroid 125 mcg p.o. daily. 8. Viscous lidocaine. 9. Benadryl and Maalox suspension 2 teaspoons 4 times a day for his     radiation pharyngitis. 10.Phenergan 10 mg 1 tablet every 6 hours as needed. 11.Zofran 4 mg 1 tablet every 12 hours as needed. 12.Nexium 40 mg daily. 13.Hydromorph 4 mg 1 tablet 3 times a day as needed. 14.Lorazepam 1 mg 1 tablet daily before radiation.  LABORATORY DATA:  Urinalysis shows negative nitrites, negative leukocyte esterase on dip and negative blood.  Microscopic was not done.  CBC shows a white count 2300, red blood cell count 2.66 million per microliter, hemoglobin 9.2 gm/dL, hematocrit 40.9%, MCV 101.1, MCH 34.6 and MCHC 34.2.  RDW 14.1 within the normal range.  Platelet count is 176000 per microliter.  He has normal differential, high monocytes and low granulocytes.  Basic metabolic panel is normal except for glucose of 104 mg/dL and a creatinine of 1.5.  X-ray shows increased opacity seen in the right lung base on both PA and lateral films consistent with early developing infection.  PHYSICAL EXAMINATION:  GENERAL:  Well-developed, thin Caucasian male, in no acute distress. VITAL SIGNS:  T-max 102, T present 98.9 post receiving Tylenol at home. Blood pressure 95/60, pulse 93, respirations 18. HEENT:  Examination of head is  atraumatic, normocephalic.  Eyes:  Pupils are equal, round, reactive to light.  Discs sharp.  Extraocular muscle range of motion is full.  Ear, nose and throat; mucous membranes moist. No erythema noted. NECK:  He has got a trach. CHEST:  Nontender. LUNGS:  Scattered rhonchi clearing with cough as well as scattered wheezing clearing with cough. HEART:  Regular rhythm and rate without murmur or rub. ABDOMEN:  Soft, nontender with normoactive bowel sounds.  No hepatomegaly.  No splenomegaly.  No palpable mass. GENITAL EXAM:  Normal. RECTAL EXAM:  Hemoccult negative. EXTREMITIES:  There is no clubbing, cyanosis or edema. SKIN:  No unusual rash or lesions noted. NEURO:  Normal.  Cranial nerves II through XII intact.  Motor strength 5/5 in upper and lower extremities.  Sensory intact to fine touch and pinprick.  Mental status exam is normal.  IMPRESSION:  My impression is right lower lobe pneumonia in an immunocompromised patient, leukopenia, fever; and patient with history of laryngeal cancer receiving chemo and radiation therapy.  PLAN:  The plan is to admit the patient and continue with the antibiotics started in the emergency room.  Obtain a.m. labs.  Blood, sputum and cultures are pending.  The patient will receive neb treatments, mucolytics and further evaluation and treatment as indicated by hospital course.  Dr. Gaylyn Rong will be notified regarding his admission. This history and physical is complete.  The patient is being admitted to [facility] 3.     Arne Cleveland, MD     ML/MEDQ  D:  02/15/2010  T:  02/16/2010  Job:  [id number]  Electronically Signed by Arne Cleveland  on 02/25/2010 02:49:55 PM

## 2010-02-25 NOTE — H&P (Signed)
  [name], [name]                ACCOUNT NO.:  [id number]  MEDICAL RECORD NO.:  [id number]           PATIENT TYPE:  I  LOCATION:  1429                         FACILITY:  [facility]  PHYSICIAN:  Arne Cleveland, MD       DATE OF BIRTH:  May 22, 1945  DATE OF ADMISSION:  02/15/2010 DATE OF DISCHARGE:  02/15/2010                             HISTORY & PHYSICAL   ADDENDUM:  SOCIAL HISTORY:  The patient lives with family.  He does not smoke, drink or use illicit drugs.  He used to smoke but stopped smoking in March 2011.  FAMILY HISTORY:  Noncontributory.     Arne Cleveland, MD     ML/MEDQ  D:  02/15/2010  T:  02/15/2010  Job:  [id number]  Electronically Signed by Arne Cleveland  on 02/25/2010 02:49:29 PM

## 2010-03-03 ENCOUNTER — Other Ambulatory Visit: Payer: Self-pay | Admitting: Oncology

## 2010-03-03 DIAGNOSIS — C329 Malignant neoplasm of larynx, unspecified: Secondary | ICD-10-CM

## 2010-03-04 ENCOUNTER — Inpatient Hospital Stay (HOSPITAL_COMMUNITY)
Admission: EM | Admit: 2010-03-04 | Discharge: 2010-03-16 | DRG: 312 | Disposition: A | Discharge status: Home / Self Care | Payer: MEDICARE | Attending: Internal Medicine | Admitting: Internal Medicine

## 2010-03-04 ENCOUNTER — Emergency Department (HOSPITAL_COMMUNITY): Payer: MEDICARE

## 2010-03-04 DIAGNOSIS — I472 Ventricular tachycardia, unspecified: Secondary | ICD-10-CM | POA: Diagnosis present

## 2010-03-04 DIAGNOSIS — I252 Old myocardial infarction: Secondary | ICD-10-CM

## 2010-03-04 DIAGNOSIS — C329 Malignant neoplasm of larynx, unspecified: Secondary | ICD-10-CM | POA: Diagnosis present

## 2010-03-04 DIAGNOSIS — I951 Orthostatic hypotension: Principal | ICD-10-CM | POA: Diagnosis present

## 2010-03-04 DIAGNOSIS — M4800 Spinal stenosis, site unspecified: Secondary | ICD-10-CM | POA: Diagnosis present

## 2010-03-04 DIAGNOSIS — E039 Hypothyroidism, unspecified: Secondary | ICD-10-CM | POA: Diagnosis present

## 2010-03-04 DIAGNOSIS — Z9861 Coronary angioplasty status: Secondary | ICD-10-CM

## 2010-03-04 DIAGNOSIS — R5381 Other malaise: Secondary | ICD-10-CM | POA: Diagnosis present

## 2010-03-04 DIAGNOSIS — I2510 Atherosclerotic heart disease of native coronary artery without angina pectoris: Secondary | ICD-10-CM | POA: Diagnosis present

## 2010-03-04 DIAGNOSIS — I4729 Other ventricular tachycardia: Secondary | ICD-10-CM | POA: Diagnosis present

## 2010-03-04 DIAGNOSIS — IMO0002 Reserved for concepts with insufficient information to code with codable children: Secondary | ICD-10-CM | POA: Diagnosis present

## 2010-03-04 LAB — COMPREHENSIVE METABOLIC PANEL
AST: 25 U/L (ref 0–37)
Albumin: 3.5 g/dL (ref 3.5–5.2)
BUN: 22 mg/dL (ref 6–23)
Chloride: 104 mEq/L (ref 96–112)
Creatinine, Ser: 1.24 mg/dL (ref 0.4–1.5)
GFR calc Af Amer: >60 mL/min (ref >60)
Potassium: 4 mEq/L (ref 3.5–5.1)
Total Protein: 7 g/dL (ref 6.0–8.3)

## 2010-03-04 LAB — DIFFERENTIAL
Basophils Absolute: 0 10*3/uL (ref 0.0–0.1)
Basophils Relative: 1 % (ref 0–1)
Eosinophils Absolute: 0 10*3/uL (ref 0.0–0.7)
Lymphocytes Relative: 26 % (ref 12–46)
Monocytes Absolute: 0.6 10*3/uL (ref 0.1–1.0)
Neutrophils Relative %: 59 % (ref 43–77)

## 2010-03-04 LAB — T4, FREE: Free T4: 1.4 ng/dL (ref 0.80–1.80)

## 2010-03-04 LAB — URINALYSIS, ROUTINE W REFLEX MICROSCOPIC
Nitrite: NEGATIVE
Protein, ur: NEGATIVE mg/dL
Specific Gravity, Urine: 1.025 (ref 1.005–1.030)
Urobilinogen, UA: 1 mg/dL (ref 0.0–1.0)

## 2010-03-04 LAB — CBC
MCV: 98.2 fL (ref 78.0–100.0)
Platelets: 246 10*3/uL (ref 150–400)
RBC: 3.28 MIL/uL — ABNORMAL LOW (ref 4.22–5.81)
RDW: 15.8 % — ABNORMAL HIGH (ref 11.5–15.5)
WBC: 4.5 10*3/uL (ref 4.0–10.5)

## 2010-03-04 LAB — CK TOTAL AND CKMB (NOT AT ARMC)
CK, MB: 0.7 ng/mL (ref 0.3–4.0)
Total CK: 33 U/L (ref 7–232)

## 2010-03-04 LAB — TSH: TSH: 0.039 u[IU]/mL — ABNORMAL LOW (ref 0.350–4.500)

## 2010-03-04 LAB — TYPE AND SCREEN: Antibody Screen: NEGATIVE

## 2010-03-05 DIAGNOSIS — R55 Syncope and collapse: Secondary | ICD-10-CM

## 2010-03-05 LAB — BASIC METABOLIC PANEL
BUN: 21 mg/dL (ref 6–23)
CO2: 23 mEq/L (ref 19–32)
Chloride: 111 mEq/L (ref 96–112)
Creatinine, Ser: 1.03 mg/dL (ref 0.4–1.5)
Glucose, Bld: 100 mg/dL — ABNORMAL HIGH (ref 70–99)

## 2010-03-05 LAB — D-DIMER, QUANTITATIVE: D-Dimer, Quant: 0.74 ug/mL-FEU — ABNORMAL HIGH (ref 0.00–0.48)

## 2010-03-05 LAB — CARDIAC PANEL(CRET KIN+CKTOT+MB+TROPI)
Relative Index: INVALID (ref 0.0–2.5)
Relative Index: INVALID (ref 0.0–2.5)

## 2010-03-05 LAB — LIPID PANEL
Cholesterol: 163 mg/dL (ref 0–200)
HDL: 30 mg/dL — ABNORMAL LOW (ref >39)
LDL Cholesterol: 104 mg/dL — ABNORMAL HIGH (ref 0–99)
Triglycerides: 144 mg/dL (ref <150)

## 2010-03-05 LAB — CBC
HCT: 27.4 % — ABNORMAL LOW (ref 39.0–52.0)
Hemoglobin: 9.2 g/dL — ABNORMAL LOW (ref 13.0–17.0)
MCH: 33.6 pg (ref 26.0–34.0)
MCHC: 33.6 g/dL (ref 30.0–36.0)
MCV: 100 fL (ref 78.0–100.0)
RBC: 2.74 MIL/uL — ABNORMAL LOW (ref 4.22–5.81)

## 2010-03-05 LAB — CK TOTAL AND CKMB (NOT AT ARMC): Relative Index: INVALID (ref 0.0–2.5)

## 2010-03-05 LAB — MAGNESIUM: Magnesium: 2.2 mg/dL (ref 1.5–2.5)

## 2010-03-06 ENCOUNTER — Inpatient Hospital Stay (HOSPITAL_COMMUNITY): Payer: MEDICARE

## 2010-03-06 LAB — COMPREHENSIVE METABOLIC PANEL
ALT: 13 U/L (ref 0–53)
Albumin: 2.7 g/dL — ABNORMAL LOW (ref 3.5–5.2)
Alkaline Phosphatase: 56 U/L (ref 39–117)
Potassium: 4.1 mEq/L (ref 3.5–5.1)
Sodium: 144 mEq/L (ref 135–145)
Total Protein: 5 g/dL — ABNORMAL LOW (ref 6.0–8.3)

## 2010-03-06 LAB — CORTISOL: Cortisol, Plasma: 6.6 ug/dL

## 2010-03-06 LAB — DIFFERENTIAL
Basophils Absolute: 0 10*3/uL (ref 0.0–0.1)
Eosinophils Relative: 4 % (ref 0–5)
Lymphocytes Relative: 25 % (ref 12–46)
Neutrophils Relative %: 53 % (ref 43–77)

## 2010-03-06 LAB — CBC
HCT: 25.4 % — ABNORMAL LOW (ref 39.0–52.0)
Platelets: 205 10*3/uL (ref 150–400)
RDW: 15.9 % — ABNORMAL HIGH (ref 11.5–15.5)
WBC: 2.7 10*3/uL — ABNORMAL LOW (ref 4.0–10.5)

## 2010-03-06 MED ORDER — IOHEXOL 300 MG/ML  SOLN
100.0000 mL | Freq: Once | INTRAMUSCULAR | Status: AC | PRN
  Administered 2010-03-06: 100 mL via INTRAVENOUS

## 2010-03-07 DIAGNOSIS — R55 Syncope and collapse: Secondary | ICD-10-CM

## 2010-03-07 LAB — CBC
HCT: 24.9 % — ABNORMAL LOW (ref 39.0–52.0)
Platelets: 193 10*3/uL (ref 150–400)
RDW: 16.1 % — ABNORMAL HIGH (ref 11.5–15.5)
WBC: 3.2 10*3/uL — ABNORMAL LOW (ref 4.0–10.5)

## 2010-03-07 LAB — DIFFERENTIAL
Basophils Absolute: 0 10*3/uL (ref 0.0–0.1)
Basophils Relative: 1 % (ref 0–1)
Eosinophils Absolute: 0.1 10*3/uL (ref 0.0–0.7)
Eosinophils Relative: 4 % (ref 0–5)
Lymphocytes Relative: 24 % (ref 12–46)

## 2010-03-07 LAB — BASIC METABOLIC PANEL
BUN: 10 mg/dL (ref 6–23)
CO2: 23 mEq/L (ref 19–32)
Chloride: 114 mEq/L — ABNORMAL HIGH (ref 96–112)
Creatinine, Ser: 0.87 mg/dL (ref 0.4–1.5)
GFR calc Af Amer: >60 mL/min (ref >60)
Glucose, Bld: 96 mg/dL (ref 70–99)

## 2010-03-07 NOTE — Consult Note (Signed)
[name], [name]                ACCOUNT NO.:  [id number]  MEDICAL RECORD NO.:  [id number]           PATIENT TYPE:  I  LOCATION:  1445                         FACILITY:  [facility]  PHYSICIAN:  Peter C. Eden Emms, MD, FACCDATE OF BIRTH:  1945/01/24  DATE OF CONSULTATION: DATE OF DISCHARGE:                                CONSULTATION   HISTORY OF PRESENT ILLNESS:  [name] is an unfortunate 65 year old patient we are asked to see for positive enzymes.  He looks much older than his stated age.  He has a history of laryngeal cancer.  He is status post multiple head and neck surgeries including lymph node dissection on the left.  He finished XRT and chemotherapy a few weeks ago.  Since being home, he has had a poor appetite.  He came to the hospital after falling out on Friday.  He has profound postural hypotension.  There was a question of pneumonia when he was initially in the emergency room.  However, subsequent chest x-rays have not shown this.  He denies any fever, cough, or sputum production.  The patient has had a persistently elevated troponin level in the 3-4 range.  He has no history of coronary disease.  He is not having any significant chest pain.  The patient's EKG is normal.  His CPKs were all normal, and I reviewed his echocardiogram, and outside of the aortic valve sclerosis and trivial aortic insufficiency, his echo was normal with no regional wall motion abnormalities.  The patient currently just feels weak with a poor appetite, poor taste, and continues to have significant postural hypotension.  PAST MEDICAL HISTORY: 1. Colon cancer, status post resection. 2. History of laryngectomy for laryngeal cancer with permanent     tracheostomy. 3. Previous history of stenting of the RCA at [facility].     Apparently, he has cardiologist named Dr. Bishop Limbo at [facility].  FAMILY HISTORY:  Noncontributory.  SOCIAL HISTORY:  The patient is a  previous smoker.  He has 2 children. He denies drug use.  He is a retired [employer].  A lot of the history was gotten from his daughter since the patient has to speak with a laryngeal microphone.  HOME MEDICATIONS: 1. Fentanyl. 2. Hydromorphone. 3. Fluconazole 10 mg a day. 4. Levothyroxine 125 mcg a day. 5. Lidocaine. 6. Carafate.  ALLERGIES: 1. ASPIRIN. 2. IBUPROFEN. 3. CODEINE.  Note should be made that his advance directives status was DNR in his admission H and P on March 04, 2010; however, there is a note in the chart from today saying he is a full code.  PHYSICAL EXAMINATION:  GENERAL:  Remarkable for a chronically ill, pale male currently in no distress.  He looks much older than his stated age. He is still profoundly postural.  Most recent by postural signs from March 06, 2010, showed a lying blood pressure of 111/58, sitting blood pressure of 97/46, standing blood pressure is 67/34, heart rate is 80 and regular. HEENT:  Remarkable for previous left-sided neck surgery with a permanent tracheostomy.  There is fibrotic tissue in  both supraclavicular fossa. LUNGS:  Clear. HEART:  Sounds are normal.  PMI is normal. ABDOMEN:  Benign.  Status post colectomy.  No AAA.  No tenderness.  No bruit.  No hepatosplenomegaly or hepatojugular reflex/tenderness. EXTREMITIES:  Distal pulses are intact.  No edema. NEUROLOGIC:  Nonfocal. SKIN:  Warm and dry.  No obvious muscular weakness.  LABORATORY DATA:  His current lab work is remarkable for potassium of 3.8, BUN 10, creatinine 0.8, hematocrit is still low at 24.9, platelet count 193,000.  Random cortisol normal at 6.6, D-dimer elevated at 0.74. Cardiac panels as indicated, all negative CPKs with chronically elevated troponin in the 3-4 range.  CT angiogram showed no PE with a triangular density in the right lower lobe extending into a partially collapsed right lower lobe.  IMPRESSION AND PLAN:  Positive troponin not  clinically significant, although we do not have exact records on his history of coronary disease.  He is clearly not having any chest pain.  His creatine phosphokinases are negative.  There is no wall motion abnormality on his echocardiogram, and there is absolutely no indication that any of his postural symptoms have anything to do with his heart.  The patient has lost weight, has undergone XRT and chemotherapy, has required a recent transfusion and still is quite anemic with a poor appetite.  This combined with his narcotics for pain control and head and neck surgery all have contributed to his postural hypotension.  Right now, he is at higher risk for falling and hurting himself due to his profound postural hypotension.  Then any acute cardiac event as such I think it is reasonable to start midodrine 10 mg 3 times a day and consider addition Florinef.  Primary service can consider transfusion to hemoglobin of 10.  Further workup in regards to his right lower lobe collapse and possible pneumonia may be in order.  The patient does not need further cardiac workup inpatient.  He can follow up with his primary cardiologist at [facility] after hospital discharge and follow up with his primary MD regarding his postural hypotension.    Noralyn Pick. Eden Emms, MD, [facility]    PCN/MEDQ  D:  03/07/2010  T:  03/07/2010  Job:  [id number]  Electronically Signed by Charlton Haws MD [facility] on 03/07/2010 05:02:21 PM

## 2010-03-09 LAB — CBC
HCT: 26.1 % — ABNORMAL LOW (ref 39.0–52.0)
Hemoglobin: 9 g/dL — ABNORMAL LOW (ref 13.0–17.0)
MCH: 34 pg (ref 26.0–34.0)
MCV: 98.5 fL (ref 78.0–100.0)
RBC: 2.65 MIL/uL — ABNORMAL LOW (ref 4.22–5.81)

## 2010-03-10 DIAGNOSIS — I951 Orthostatic hypotension: Secondary | ICD-10-CM

## 2010-03-10 LAB — BASIC METABOLIC PANEL
BUN: 9 mg/dL (ref 6–23)
GFR calc non Af Amer: >60 mL/min (ref >60)
Glucose, Bld: 107 mg/dL — ABNORMAL HIGH (ref 70–99)
Potassium: 3.9 mEq/L (ref 3.5–5.1)

## 2010-03-10 LAB — CBC
HCT: 27.8 % — ABNORMAL LOW (ref 39.0–52.0)
MCH: 33.7 pg (ref 26.0–34.0)
MCHC: 33.8 g/dL (ref 30.0–36.0)
MCV: 99.6 fL (ref 78.0–100.0)
RDW: 17 % — ABNORMAL HIGH (ref 11.5–15.5)
WBC: 6.7 10*3/uL (ref 4.0–10.5)

## 2010-03-10 LAB — URINALYSIS, ROUTINE W REFLEX MICROSCOPIC
Bilirubin Urine: NEGATIVE
Ketones, ur: NEGATIVE mg/dL
Nitrite: NEGATIVE
pH: 6.5 (ref 5.0–8.0)

## 2010-03-11 LAB — CARDIAC PANEL(CRET KIN+CKTOT+MB+TROPI): CK, MB: 0.8 ng/mL (ref 0.3–4.0)

## 2010-03-11 LAB — BASIC METABOLIC PANEL
BUN: 11 mg/dL (ref 6–23)
Chloride: 112 mEq/L (ref 96–112)
Glucose, Bld: 115 mg/dL — ABNORMAL HIGH (ref 70–99)
Potassium: 4 mEq/L (ref 3.5–5.1)

## 2010-03-11 LAB — CULTURE, BLOOD (ROUTINE X 2)
Culture  Setup Time: 201202251154
Culture  Setup Time: 201202251154
Culture: NO GROWTH
Culture: NO GROWTH

## 2010-03-11 LAB — MAGNESIUM: Magnesium: 2.3 mg/dL (ref 1.5–2.5)

## 2010-03-12 DIAGNOSIS — R55 Syncope and collapse: Secondary | ICD-10-CM

## 2010-03-12 LAB — BASIC METABOLIC PANEL
GFR calc Af Amer: >60 mL/min (ref >60)
GFR calc non Af Amer: >60 mL/min (ref >60)
Potassium: 3.8 mEq/L (ref 3.5–5.1)
Sodium: 142 mEq/L (ref 135–145)

## 2010-03-13 LAB — BASIC METABOLIC PANEL
Chloride: 113 mEq/L — ABNORMAL HIGH (ref 96–112)
GFR calc Af Amer: >60 mL/min (ref >60)
Potassium: 3.7 mEq/L (ref 3.5–5.1)

## 2010-03-14 LAB — BASIC METABOLIC PANEL
CO2: 23 mEq/L (ref 19–32)
Calcium: 8.6 mg/dL (ref 8.4–10.5)
Chloride: 113 mEq/L — ABNORMAL HIGH (ref 96–112)
GFR calc Af Amer: >60 mL/min (ref >60)
Sodium: 143 mEq/L (ref 135–145)

## 2010-03-15 LAB — BASIC METABOLIC PANEL
CO2: 25 mEq/L (ref 19–32)
GFR calc Af Amer: >60 mL/min (ref >60)
GFR calc non Af Amer: >60 mL/min (ref >60)
Glucose, Bld: 104 mg/dL — ABNORMAL HIGH (ref 70–99)
Potassium: 3.7 mEq/L (ref 3.5–5.1)
Sodium: 144 mEq/L (ref 135–145)

## 2010-03-16 LAB — BASIC METABOLIC PANEL
BUN: 10 mg/dL (ref 6–23)
Chloride: 115 mEq/L — ABNORMAL HIGH (ref 96–112)
Potassium: 3.2 mEq/L — ABNORMAL LOW (ref 3.5–5.1)
Sodium: 144 mEq/L (ref 135–145)

## 2010-03-17 NOTE — Progress Notes (Signed)
[name], Mooney                ACCOUNT NO.:  [id number]  MEDICAL RECORD NO.:  [id number]           PATIENT TYPE:  I  LOCATION:  1445                         FACILITY:  [facility]  PHYSICIAN:  Altha Harm, MDDATE OF BIRTH:  Jan 21, 1945                                PROGRESS NOTE   DISCHARGE DIAGNOSES: 1. Postural/orthostatic hypotension. 2. Laryngeal cancer.  The patient has completed chemotherapy and     radiation therapy. 3. Coronary artery disease, stable. 4. Status post tracheostomy. 5. Generalized weakness, improved. 6. History of non-ST elevated myocardial infarction in 2010. 7. Status post of stenting of the right coronary artery at [facility]. 8. Degenerative disk disease. 9. Spinal stenosis and chronic back pain. 10.Hypothyroidism.  DISCHARGE MEDICATIONS: Medications for discharge to be dictated at time of discharge by discharging physician.  PHYSICAL EXAMINATION: GENERAL:  The patient appears to be doing very well today, ambulating in the hall with assistance. VITAL SIGNS:  His temperature is 98.3, heart rate 73, blood pressure 111/64 sitting and 94/58 standing.  Respiratory rate 16, O2 sats 97% on room air. LUNGS:  Clear to auscultation.  No wheezing or rhonchi noted. CARDIOVASCULAR:  He has got a normal S1 and S2.  No murmurs, rubs or gallops are noted.  PMI is nondisplaced.  No heaves or thrills on palpation. ABDOMEN:  Obese, soft, nontender, nondistended.  No masses, no hepatosplenomegaly noted. NEUROLOGIC:  Neurologically, he has got no focal neurological deficits. Cranial nerves II through XII are grossly intact. PSYCHIATRIC:  He is alert and oriented x3.  Good insight and cognition. Good recent and remote recall.  ASSESSMENT AND PLAN: 1. Postural hypertension.  The patient upon arrival was evaluated both     cardiac and neurologically.  The patient did have elevation of his     troponin, however, this was felt to not be clinically  significant     and certainly not implicated in his hypertension.  A 2-D     echocardiogram was done which showed that the patient had normal     ejection fraction and left ventricular architecture.  The patient     has also been on a telemetry monitor since admission and shown no     arrhythmias.  I asked Dr. Eden Emms from Cardiology to see the patient     and comment on it.  He is in full agreement with this and feels     that the patient would not benefit from any further testing.     Midodrine was started on the patient 10 mg t.i.d. and the patient     actually within 24 hours has shown some improvement in terms of his     postural hypertension.  Although his blood pressure is dropping, it     is not dropping down as far as it had before.  I would think that     the goal of therapy for this patient is to get his blood pressure     up to maybe in the 100s in the standing position where he does not     have any  symptoms of dizziness.  Please note that initially the     patient states that he was not having any dizziness; however,     during this hospitalization with ambulation, dizziness was elicited     which goes more towards orthostatic hypotension being the cause of     his syncopal episode. 2. Syncope, see above. 3. Laryngeal cancer.  The patient has completed his therapy.  He has     been under the care of Dr. Gaylyn Rong.  Dr. Gaylyn Rong came by and saw the patient     for a social visit and he will follow up with him as an patient. 4. Coronary artery disease.  This is stable.  The patient has no     evidence of chest pain or any signs and symptoms of active coronary     disease.  DIAGNOSTIC STUDIES: Include the following: 1. A 2-D echocardiogram which shows mild LVH in the left ventricle     with systolic function preserved in the range of ejection fraction     of 60% to 65%, no regional wall motion abnormalities, and there is     grade 1 diastolic dysfunction. 2. CT angiogram of the chest  which shows no evidence of pulmonary     embolus.  There is spiculated right upper lobe lesion consistent     with neoplasm. 3. CT of the head without contrast which shows stable normal     noncontrast CT appearance of the brain for his age. 4. Two-view chest x-ray which shows COPD changes and no acute     findings.  This brings [place] up-to-date on the patient's clinical course.     Altha Harm, MD     MAM/MEDQ  D:  03/08/2010  T:  03/08/2010  Job:  [id number]  Electronically Signed by Marthann Schiller MD on 03/16/2010 10:16:58 PM

## 2010-03-17 NOTE — Discharge Summary (Signed)
[name], [name]                ACCOUNT NO.:  [id number]  MEDICAL RECORD NO.:  [id number]           PATIENT TYPE:  I  LOCATION:  1445                         FACILITY:  [facility]  PHYSICIAN:  Talmage Nap, MD  DATE OF BIRTH:  24-Nov-1945  DATE OF ADMISSION:  03/04/2010 DATE OF DISCHARGE:  03/16/2010                        DISCHARGE SUMMARY - REFERRING   The patient was  seen by me in this admission only today which is March 16, 2010.  PRIMARY CARE PHYSICIAN AND ONCOLOGIST:  Jethro Bolus, M.D.  DISCHARGE DIAGNOSES: 1. Postural hypertension - BP stable on discharge. 2. Nonsustained ventricular tachycardia, the patient is     asymptomatic- stable. 3. Syncope, resolved on discharge. 4. History of laryngeal carcinoma, status post laryngectomy. 5. Bilateral submandibular lymphadenitis. 6. Anemia. 7. Coronary artery disease, status post stent. 8. Hypothyroidism. 9. Hyperlipidemia. 10.Degenerative joint disease. 11.History of spinal stenosis. 12.Chronic low back pain. 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 14.Hypothyroidism.  HOSPITAL COURSE:  Please refer to the progress notes dictated on March 08, 2010 by Dr. Griselda Miner and also March 13, 2010 by Dr. Ramiro Harvest.  The patient was however seen by me for the very first time in this admission today complained of bilateral neck pain.  He however denied any associated shortness of breath.  PHYSICAL EXAMINATION:  GENERAL:  Examination of the patient showed the patient not to be in any distress. VITAL SIGNS:  Blood pressure is 106/66, pulse is 99, respiratory rate 18, temperature is 98.5. NECK EXAM:  Showed bilateral neck mass.  The right mass is slightly tender and erythematous.  Masses has been nonmobile and trachea ostium in situ.  Other exams were essentially unremarkable.  LABORATORY DATA:  Available labs prior to discharge include basic metabolic panel which showed sodium of 144, potassium of 3.2, chloride of 115 with a bicarb of  23, glucose is 97, BUN is 10, creatinine is 0.87.  The patient is however, medically stable.  Plan is for the patient to be discharged home today on activity as tolerated.  Low-sodium, low-cholesterol diet.  He is to follow with his primary care as well as his oncologist in 1-2 weeks.  MEDICATIONS:  Medication to be taken at home will include: 1. Potassium chloride 10 mEq p.o. b.i.d. 2. Acetaminophen 325 mg 2 tablets p.o. q. four p.r.n. 3. Aspirin 81 mg one p.o. daily. 4. Ensure (Ensure liquid) 2-3 mL by mouth p.r.n. 5. Fludrocortisone 0.1 mg (Florinef) 2 tablets p.o. daily. 6. Levaquin 500 mg 1 p.o. daily for the 7 days for the submandibular     lymphadenitis. 7. Megestrol acetate (Megace) 40 mg 1 p.o. b.i.d. 8. Midodrine 5 mg 2 tablets p.o. t.i.d. with meals. 9. Zolpidem 5 mg (Ambien) 1 tablet p.o. daily p.r.n. at night. 10.Aquaphor ointment for dry skin, apply topically b.i.d. p.r.n. 11.Aristocort (triamcinolone) 0.1% topically apply daily. 12.Cetaphil lotion, apply topically b.i.d. 13.Fentanyl patch 25 mcg transdermal q. 3 days for pain. 14.Hydromorphone 4 mg 1 p.o. t.i.d. p.r.n. for pain. 15.Levothyroxine 125 mcg one p.o. q.a.m. 16.Lidocaine 2% (Benadryl/Maalox) suspension 2 teaspoons by mouth four     times daily with meals.     Talmage Nap,  MD     CN/MEDQ  D:  03/16/2010  T:  03/16/2010  Job:  [id number]  Electronically Signed by Talmage Nap  on 03/16/2010 07:16:47 PM

## 2010-03-21 ENCOUNTER — Encounter (HOSPITAL_BASED_OUTPATIENT_CLINIC_OR_DEPARTMENT_OTHER): Payer: MEDICARE | Admitting: Oncology

## 2010-03-21 ENCOUNTER — Other Ambulatory Visit: Payer: Self-pay | Admitting: Radiation Oncology

## 2010-03-21 DIAGNOSIS — C329 Malignant neoplasm of larynx, unspecified: Secondary | ICD-10-CM

## 2010-03-21 LAB — CBC WITH DIFFERENTIAL/PLATELET
Basophils Absolute: 0 10*3/uL (ref 0.0–0.1)
Eosinophils Absolute: 0.2 10*3/uL (ref 0.0–0.5)
HCT: 26.1 % — ABNORMAL LOW (ref 38.4–49.9)
HGB: 8.8 g/dL — ABNORMAL LOW (ref 13.0–17.1)
LYMPH%: 12.5 % — ABNORMAL LOW (ref 14.0–49.0)
MCHC: 33.9 g/dL (ref 32.0–36.0)
MONO#: 0.7 10*3/uL (ref 0.1–0.9)
NEUT%: 72.5 % (ref 39.0–75.0)
Platelets: 359 10*3/uL (ref 140–400)
WBC: 5.8 10*3/uL (ref 4.0–10.3)
lymph#: 0.7 10*3/uL — ABNORMAL LOW (ref 0.9–3.3)

## 2010-03-22 LAB — BASIC METABOLIC PANEL
BUN: 7 mg/dL (ref 6–23)
Calcium: 8 mg/dL — ABNORMAL LOW (ref 8.4–10.5)
GFR calc non Af Amer: >60 mL/min (ref >60)
Glucose, Bld: 132 mg/dL — ABNORMAL HIGH (ref 70–99)
Potassium: 3.9 mEq/L (ref 3.5–5.1)
Sodium: 139 mEq/L (ref 135–145)

## 2010-03-22 LAB — COMPREHENSIVE METABOLIC PANEL
CO2: 27 mEq/L (ref 19–32)
Calcium: 8.9 mg/dL (ref 8.4–10.5)
Chloride: 108 mEq/L (ref 96–112)
Creatinine, Ser: 0.85 mg/dL (ref 0.4–1.5)
GFR calc non Af Amer: >60 mL/min (ref >60)
Glucose, Bld: 87 mg/dL (ref 70–99)
Total Bilirubin: 0.5 mg/dL (ref 0.3–1.2)

## 2010-03-22 LAB — SURGICAL PCR SCREEN
MRSA, PCR: POSITIVE — AB
Staphylococcus aureus: POSITIVE — AB

## 2010-03-22 LAB — CULTURE, ROUTINE-ABSCESS

## 2010-03-22 LAB — CBC
HCT: 35.2 % — ABNORMAL LOW (ref 39.0–52.0)
HCT: 39 % (ref 39.0–52.0)
Hemoglobin: 12.9 g/dL — ABNORMAL LOW (ref 13.0–17.0)
MCH: 35.7 pg — ABNORMAL HIGH (ref 26.0–34.0)
MCHC: 33 g/dL (ref 30.0–36.0)
MCHC: 33.1 g/dL (ref 30.0–36.0)
MCV: 108 fL — ABNORMAL HIGH (ref 78.0–100.0)
RBC: 3.61 MIL/uL — ABNORMAL LOW (ref 4.22–5.81)
RDW: 13.3 % (ref 11.5–15.5)
WBC: 11 10*3/uL — ABNORMAL HIGH (ref 4.0–10.5)

## 2010-03-22 LAB — GLUCOSE, CAPILLARY: Glucose-Capillary: 96 mg/dL (ref 70–99)

## 2010-03-22 LAB — VANCOMYCIN, TROUGH: Vancomycin Tr: 16.2 ug/mL (ref 10.0–20.0)

## 2010-03-22 LAB — PREALBUMIN: Prealbumin: 25.2 mg/dL (ref 18.0–45.0)

## 2010-03-27 LAB — POCT HEMOGLOBIN-HEMACUE: Hemoglobin: 13.6 g/dL (ref 13.0–17.0)

## 2010-03-28 ENCOUNTER — Ambulatory Visit: Payer: MEDICARE | Attending: Radiation Oncology | Admitting: Radiation Oncology

## 2010-03-29 NOTE — Progress Notes (Signed)
  [name]                ACCOUNT NO.:  [id number]  MEDICAL RECORD NO.:  [id number]           PATIENT TYPE:  I  LOCATION:  1429                         FACILITY:  [facility]  PHYSICIAN:  John Fellers, MD   DATE OF BIRTH:  05/14/1945                                PROGRESS NOTE   SUBJECTIVE: This is a 65 year old male with history of laryngeal cancer, currently on chemotherapy and radiation treatment, who was admitted yesterday for right lower lobe pneumonia.  The patient is somewhat drowsy, probably related to Ativan given a while ago.  No new chest pain or shortness of breath.  Family is concerned about irritation around the neck stoma area.  OBJECTIVE: VITAL SIGNS:  Blood pressure is 99/61, pulse 51, respirations 18, temperature 99.5. GENERAL:  The patient is sleeping but easily arousable, appeared to be in no respiratory distress. HEENT:  PERRLA. NECK:  Supple.  No JVD.  He has some dry erythematous area around the stoma.  I did not see any obvious discharge. LUNGS:  Reduced breath sounds at the bases. HEART:  S1, S2.  No murmurs, rubs or gallops. ABDOMEN:  Full, soft, nontender. EXTREMITIES:  No edema. NEUROLOGIC:  No deficits.  LABORATORY DATA: Sputum culture is growing moderate gram-positive cocci in pairs.  The patient has prior history of MRSA.  Blood culture, no growth so far in 24 hours.  Chemistry; glucose 121, potassium 4.6, BUN 18, creatinine 1.9.  White count is 1.9, hemoglobin 8, platelet count is 154,000.  ASSESSMENT: 1. Right lower lobe pneumonia in an immunocompromised patient,     currently on chemotherapy and radiation. 2. Worsening anemia and leukopenia. 3. History of laryngeal cancer, on chemotherapy and radiation.  PLAN: Continue vancomycin and Zosyn.  Follow sputum culture and blood culture. The patient is appropriately on mucolytics. Radiation will be continued. For the irritation around the stoma area, we ordered Neosporin  ointment which has been recommended to be used at home by the patient's physician.  For his reduced cell counts, Dr. Gaylyn Mooney has been notified according to the H and P.  The patient may need further treatment like Neupogen.  Overall condition is stable at this time.     John Fellers, MD     FA/MEDQ  D:  02/16/2010  T:  02/16/2010  Job:  [id number]  Electronically Signed by [name]  on 03/29/2010 02:15:52 AM

## 2010-04-01 NOTE — Progress Notes (Signed)
[name], Mooney NO.:  [id number]  MEDICAL RECORD NO.:  [id number]           PATIENT TYPE:  LOCATION:                                 FACILITY:  PHYSICIAN:  Ramiro Harvest, MD    DATE OF BIRTH:  Sep 04, 1945                                PROGRESS NOTE   This is a progress note covering the events from March 09, 2010 through March 13, 2010.  For the rest of hospitalization, please see prior progress note dictated per Dr. Ashley Royalty of job [phone].  CURRENT DIAGNOSES: 1. Postural/orthostatic hypotension. 2. Nonsustained ventricular tachycardia, asymptomatic, stable. 3. Syncope secondary to problem #1. 4. Decreased appetite, improved on Megace. 5. Hypothyroidism. 6. History of laryngeal cancer, status post chemo and radiation. 7. Coronary artery disease, stable. 8. Status post tracheostomy. 9. Generalized weakness, improved. 10.History of non-ST elevated myocardial infarction in 2010. 11.Status post stenting of the right coronary artery at [facility]. 12.Degenerative disk disease. 13.Spinal stenosis and chronic back pain.  CURRENT MEDICATIONS: 1. Aspirin 81 mg p.o. daily. 2. Lovenox 40 mg subcutaneously daily. 3. Florinef 0.2 mg p.o. q.a.m. and 0.1 mg p.o. q.p.m. 4. Synthroid 100 mcg p.o. daily. 5. Megace 400 mg p.o. b.i.d. 6. Midodrine 10 mg p.o. t.i.d.  DISPOSITION: Followup will be dictated per discharging physician.  CONSULTATIONS DONE DURING THIS TIME: Cardiology reassessed the patient and the patient was followed per [facility] during this time.  PROCEDURES PERFORMED DURING THIS TIME: None.  HOSPITAL COURSE: 1. Postural/orthostatic hypotension.  For the prior workup on his     postural hypotension, please see prior progress note dictated per     Dr. Ashley Royalty of job [phone].  During this time period, the patient     had been started on midodrine 10 mg 3 times daily with some     improvement in his blood  pressure; however, the patient continued     to be significantly orthostatic with systolic blood pressures     dropping into the 60s on standing.  A TED hose was added to the     patient's regimen, however, with no significant improvement.     Although, the patient remained asymptomatic without any dizziness.     Cardiology was asked to re-see the patient.  The patient was re-     seen back per Cardiology by Dr. Tenny Craw who added Florinef to the     patient's regimen at 0.1 mg twice daily.  An abdominal binder was     also added as well as keeping the head of the bed at 30 degrees.     The patient was monitored.  IV fluids were subsequently     discontinued as the patient had good oral intake on his Megace and     was now euvolemic.  The patient continued to have significant     orthostasis with last one as of March 13, 2010 with pressure dropped     in the systolic of 70s on standing.  Florinef dose has been     increased per Cardiology to 0.2 mg in the  morning and 0.1 mg in the     afternoon.  This will need to be followed up and assessed once the    patient's orthostatic hypotension is resolved or significantly     improved such that his systolic blood pressure on standing is in     the low 100s or high 90s, and the patient is stable.  The patient     maybe discharged.  The patient is currently in stable condition. 2. Nonsustained ventricular tachycardia during the hospitalization.     The patient was noted to have a nonsustained ventricular     tachycardia; however, he remained asymptomatic.  Beta-blocker could     not be added to his regimen secondary to problem #1.  Potassium has     been tried to be kept above 4 as well as magnesium greater than 2.     The patient has currently been stable. 3. Decreased appetite.  The patient was noted to have a decreased     appetite per the patient and it was felt that this was secondary to     his chemotherapy.  The patient was started on Megace at  400 mg     twice daily.  The patient has had good response to the Megace.  He     is tolerating good oral intake and he is eating well.  For the rest     of the hospitalization, please see prior progress note dictated per     Dr. Ashley Royalty of job [phone].  It has been a pleasure taking care of     [name].     Ramiro Harvest, MD     DT/MEDQ  D:  03/13/2010  T:  03/13/2010  Job:  [id number]  Electronically Signed by Ramiro Harvest MD on 04/01/2010 12:36:17 PM

## 2010-04-04 LAB — GLUCOSE, CAPILLARY
Glucose-Capillary: 101 mg/dL — ABNORMAL HIGH (ref 70–99)
Glucose-Capillary: 109 mg/dL — ABNORMAL HIGH (ref 70–99)
Glucose-Capillary: 126 mg/dL — ABNORMAL HIGH (ref 70–99)
Glucose-Capillary: 126 mg/dL — ABNORMAL HIGH (ref 70–99)
Glucose-Capillary: 128 mg/dL — ABNORMAL HIGH (ref 70–99)
Glucose-Capillary: 131 mg/dL — ABNORMAL HIGH (ref 70–99)
Glucose-Capillary: 134 mg/dL — ABNORMAL HIGH (ref 70–99)
Glucose-Capillary: 135 mg/dL — ABNORMAL HIGH (ref 70–99)
Glucose-Capillary: 139 mg/dL — ABNORMAL HIGH (ref 70–99)
Glucose-Capillary: 143 mg/dL — ABNORMAL HIGH (ref 70–99)
Glucose-Capillary: 143 mg/dL — ABNORMAL HIGH (ref 70–99)
Glucose-Capillary: 156 mg/dL — ABNORMAL HIGH (ref 70–99)
Glucose-Capillary: 163 mg/dL — ABNORMAL HIGH (ref 70–99)
Glucose-Capillary: 185 mg/dL — ABNORMAL HIGH (ref 70–99)
Glucose-Capillary: 85 mg/dL (ref 70–99)
Glucose-Capillary: 96 mg/dL (ref 70–99)

## 2010-04-04 LAB — CBC
HCT: 22.5 % — ABNORMAL LOW (ref 39.0–52.0)
HCT: 24 % — ABNORMAL LOW (ref 39.0–52.0)
HCT: 26.1 % — ABNORMAL LOW (ref 39.0–52.0)
HCT: 27.8 % — ABNORMAL LOW (ref 39.0–52.0)
HCT: 28.8 % — ABNORMAL LOW (ref 39.0–52.0)
Hemoglobin: 13.1 g/dL (ref 13.0–17.0)
Hemoglobin: 8.2 g/dL — ABNORMAL LOW (ref 13.0–17.0)
Hemoglobin: 8.6 g/dL — ABNORMAL LOW (ref 13.0–17.0)
Hemoglobin: 8.8 g/dL — ABNORMAL LOW (ref 13.0–17.0)
Hemoglobin: 9.4 g/dL — ABNORMAL LOW (ref 13.0–17.0)
Hemoglobin: 9.5 g/dL — ABNORMAL LOW (ref 13.0–17.0)
Hemoglobin: 9.6 g/dL — ABNORMAL LOW (ref 13.0–17.0)
Hemoglobin: 9.9 g/dL — ABNORMAL LOW (ref 13.0–17.0)
MCHC: 33.3 g/dL (ref 30.0–36.0)
MCHC: 33.9 g/dL (ref 30.0–36.0)
MCV: 100.3 fL — ABNORMAL HIGH (ref 78.0–100.0)
MCV: 102.3 fL — ABNORMAL HIGH (ref 78.0–100.0)
MCV: 108 fL — ABNORMAL HIGH (ref 78.0–100.0)
MCV: 109.2 fL — ABNORMAL HIGH (ref 78.0–100.0)
Platelets: 233 10*3/uL (ref 150–400)
Platelets: 240 10*3/uL (ref 150–400)
Platelets: 377 10*3/uL (ref 150–400)
Platelets: 588 10*3/uL — ABNORMAL HIGH (ref 150–400)
Platelets: 766 10*3/uL — ABNORMAL HIGH (ref 150–400)
RBC: 2.08 MIL/uL — ABNORMAL LOW (ref 4.22–5.81)
RBC: 2.63 MIL/uL — ABNORMAL LOW (ref 4.22–5.81)
RBC: 2.72 MIL/uL — ABNORMAL LOW (ref 4.22–5.81)
RBC: 2.84 MIL/uL — ABNORMAL LOW (ref 4.22–5.81)
RDW: 14 % (ref 11.5–15.5)
RDW: 14.3 % (ref 11.5–15.5)
RDW: 15.5 % (ref 11.5–15.5)
RDW: 15.6 % — ABNORMAL HIGH (ref 11.5–15.5)
RDW: 15.8 % — ABNORMAL HIGH (ref 11.5–15.5)
RDW: 17.1 % — ABNORMAL HIGH (ref 11.5–15.5)
WBC: 10.4 10*3/uL (ref 4.0–10.5)
WBC: 11.8 10*3/uL — ABNORMAL HIGH (ref 4.0–10.5)
WBC: 11.9 10*3/uL — ABNORMAL HIGH (ref 4.0–10.5)
WBC: 12.4 10*3/uL — ABNORMAL HIGH (ref 4.0–10.5)
WBC: 13.2 10*3/uL — ABNORMAL HIGH (ref 4.0–10.5)
WBC: 8.4 10*3/uL (ref 4.0–10.5)

## 2010-04-04 LAB — MRSA PCR SCREENING: MRSA by PCR: POSITIVE — AB

## 2010-04-04 LAB — DIFFERENTIAL
Basophils Absolute: 0 10*3/uL (ref 0.0–0.1)
Basophils Absolute: 0.1 10*3/uL (ref 0.0–0.1)
Basophils Relative: 0 % (ref 0–1)
Eosinophils Absolute: 0.7 10*3/uL (ref 0.0–0.7)
Eosinophils Absolute: 0.9 10*3/uL — ABNORMAL HIGH (ref 0.0–0.7)
Eosinophils Absolute: 1.3 10*3/uL — ABNORMAL HIGH (ref 0.0–0.7)
Eosinophils Relative: 7 % — ABNORMAL HIGH (ref 0–5)
Lymphs Abs: 1.4 10*3/uL (ref 0.7–4.0)
Monocytes Absolute: 0.8 10*3/uL (ref 0.1–1.0)
Monocytes Absolute: 1.1 10*3/uL — ABNORMAL HIGH (ref 0.1–1.0)
Monocytes Relative: 6 % (ref 3–12)
Monocytes Relative: 7 % (ref 3–12)
Neutro Abs: 10.3 10*3/uL — ABNORMAL HIGH (ref 1.7–7.7)
Neutrophils Relative %: 73 % (ref 43–77)
Neutrophils Relative %: 76 % (ref 43–77)

## 2010-04-04 LAB — TYPE AND SCREEN

## 2010-04-04 LAB — ANAEROBIC CULTURE

## 2010-04-04 LAB — IRON AND TIBC
Saturation Ratios: 22 % (ref 20–55)
TIBC: 192 ug/dL — ABNORMAL LOW (ref 215–435)

## 2010-04-04 LAB — COMPREHENSIVE METABOLIC PANEL
ALT: 11 U/L (ref 0–53)
ALT: 17 U/L (ref 0–53)
AST: 17 U/L (ref 0–37)
Alkaline Phosphatase: 75 U/L (ref 39–117)
BUN: 9 mg/dL (ref 6–23)
CO2: 27 mEq/L (ref 19–32)
CO2: 32 mEq/L (ref 19–32)
Chloride: 100 mEq/L (ref 96–112)
Chloride: 105 mEq/L (ref 96–112)
GFR calc Af Amer: >60 mL/min (ref >60)
GFR calc non Af Amer: >60 mL/min (ref >60)
GFR calc non Af Amer: >60 mL/min (ref >60)
Glucose, Bld: 135 mg/dL — ABNORMAL HIGH (ref 70–99)
Potassium: 3.7 mEq/L (ref 3.5–5.1)
Sodium: 136 mEq/L (ref 135–145)
Sodium: 142 mEq/L (ref 135–145)
Total Bilirubin: 0.4 mg/dL (ref 0.3–1.2)
Total Bilirubin: 0.4 mg/dL (ref 0.3–1.2)

## 2010-04-04 LAB — BASIC METABOLIC PANEL
BUN: 5 mg/dL — ABNORMAL LOW (ref 6–23)
BUN: 9 mg/dL (ref 6–23)
CO2: 28 mEq/L (ref 19–32)
Calcium: 7.7 mg/dL — ABNORMAL LOW (ref 8.4–10.5)
Calcium: 8.6 mg/dL (ref 8.4–10.5)
Calcium: 9.1 mg/dL (ref 8.4–10.5)
Chloride: 103 mEq/L (ref 96–112)
Chloride: 104 mEq/L (ref 96–112)
Chloride: 106 mEq/L (ref 96–112)
Creatinine, Ser: 0.67 mg/dL (ref 0.4–1.5)
Creatinine, Ser: 0.76 mg/dL (ref 0.4–1.5)
Creatinine, Ser: 0.79 mg/dL (ref 0.4–1.5)
Creatinine, Ser: 0.94 mg/dL (ref 0.4–1.5)
GFR calc Af Amer: >60 mL/min (ref >60)
GFR calc Af Amer: >60 mL/min (ref >60)
GFR calc non Af Amer: >60 mL/min (ref >60)
GFR calc non Af Amer: >60 mL/min (ref >60)
GFR calc non Af Amer: >60 mL/min (ref >60)
Glucose, Bld: 123 mg/dL — ABNORMAL HIGH (ref 70–99)
Glucose, Bld: 123 mg/dL — ABNORMAL HIGH (ref 70–99)
Glucose, Bld: 161 mg/dL — ABNORMAL HIGH (ref 70–99)
Potassium: 3.4 mEq/L — ABNORMAL LOW (ref 3.5–5.1)
Potassium: 3.7 mEq/L (ref 3.5–5.1)
Sodium: 136 mEq/L (ref 135–145)
Sodium: 136 mEq/L (ref 135–145)
Sodium: 140 mEq/L (ref 135–145)

## 2010-04-04 LAB — PATHOLOGIST SMEAR REVIEW

## 2010-04-04 LAB — VITAMIN B12: Vitamin B-12: 255 pg/mL (ref 211–911)

## 2010-04-04 LAB — WOUND CULTURE

## 2010-04-04 LAB — CLOSTRIDIUM DIFFICILE EIA: C difficile Toxins A+B, EIA: NEGATIVE

## 2010-04-04 LAB — FOLATE
Folate: 17.7 ng/mL
Folate: 9.6 ng/mL

## 2010-04-04 LAB — ABO/RH: ABO/RH(D): A NEG

## 2010-04-04 LAB — PREALBUMIN: Prealbumin: 10.3 mg/dL — ABNORMAL LOW (ref 18.0–45.0)

## 2010-04-04 LAB — FERRITIN: Ferritin: 434 ng/mL — ABNORMAL HIGH (ref 22–322)

## 2010-04-04 LAB — PREPARE RBC (CROSSMATCH)

## 2010-04-04 LAB — RETICULOCYTES: RBC.: 2.9 MIL/uL — ABNORMAL LOW (ref 4.22–5.81)

## 2010-04-11 LAB — BASIC METABOLIC PANEL
Calcium: 9.5 mg/dL (ref 8.4–10.5)
GFR calc Af Amer: >60 mL/min (ref >60)
GFR calc non Af Amer: >60 mL/min (ref >60)
Sodium: 140 mEq/L (ref 135–145)

## 2010-04-25 NOTE — H&P (Signed)
NAMELESEAN, Mooney NO.:  [id number]  MEDICAL RECORD NO.:  [id number]           PATIENT TYPE:  E  LOCATION:  WLED                         FACILITY:  [facility]  PHYSICIAN:  Houston Siren, MD           DATE OF BIRTH:  06-Sep-1945  DATE OF ADMISSION:  03/04/2010 DATE OF DISCHARGE:                             HISTORY & PHYSICAL   ONCOLOGIST:  Jethro Bolus, MD.  CARDIOLOGIST:  Karl Luke, MD, at [facility].  CHIEF COMPLAINT:  Weakness, multiple syncopal episodes, and falls.  HISTORY:  This is a 65 year old unfortunate Caucasian gentleman, who presents to the emergency room today with complaints of progressive weakness, frequent falls followed by multiple syncopal episodes, recently admitted to [facility] for pneumonia and discharged approximately 2 weeks ago.  His sister who accompanied him said that his condition really has not improved much after discharge.  The patient complained of weakness, low appetite, and within the last 2 weeks, he had approximately 4 syncopal episodes when ,all of a sudden ,he would just blacked out and fell.  He sustained a laceration of the right ear and right arm in one of these falls, and today he also sustained another laceration on the left arm.  PAST MEDICAL HISTORY:  Significant for colon cancer and he had a tumor resection.  Also, he is status post complete laryngectomy for laryngeal cancer with permanent tracheostomy.  He has history of coronary artery disease and was diagnosed with non-ST MI in June 2010.  The patient underwent PCI with stenting of the RCA at [facility].  His cardiologist is Dr. Bishop Limbo.  His ejection fraction at that time was 65%.  He also has a history of degenerative disk disease and spinal stenosis with chronic back pain and a history of hypothyroidism.  FAMILY HISTORY:  Noncontributory.  SOCIAL HISTORY:  He is an ex-smoker, widower, lives alone, has 2 children, and  denied use of alcohol or illicit drugs.  He is a retired [employer].  HOME MEDICATIONS: 1. Fentanyl 25 mcg. 2. Hydromorphone 4 mg three times a day. 3. Fluconazole 10 mg daily. 4. Levothyroxine 125 mcg daily. 5. Lidocaine 2 teaspoons four times a day. 6. Carafate 2 teaspoons p.r.n.  ALLERGIES:  ASPIRIN, IBUPROFEN, and CODEINE.  CODE STATUS:  The patient is DNR.  REVIEW OF SYMPTOMS:  Essentially, he has no other complaints than that were mentioned above like weakness, low appetite, falls, and also complains of a chronic back pain.  Denied nausea or vomiting.  He denied any diarrhea, constipation.  No dysuria.  No arthralgia or myalgia.  He denied any shortness of breath or chest pain.  PHYSICAL EXAMINATION:  VITAL SIGNS:  On arrival, his first set of vital signs showed temperature 98.5, respirations 20, heart rate 113, and blood pressure 92/64.  His repeat vital signs revealed blood pressure 79/60, heart rate 88.  The patient was given IV fluids and blood pressure recovered to 110/67, heart rates remained in 90s. HEENT:  Head is normocephalic.  On the right ear, he has Steri-Strips in place for  sustained laceration after the fall.  He is edentulous. Mucosal membranes mildly dry.  Sclerae anicteric.  Extraocular movements intact. NECK:  Without JVD.  There is erythema at the base of the neck and permanent tracheostomy, exposed. LUNGS:  With mild expiratory wheeze and no significant rhonchi or rales. CARDIAC:  Heart, regular rate and rhythm.  No significant murmurs, gallops, or rubs.  No heaves. ABDOMEN:  Nontender, nondistended with positive bowel sounds x4.  No organomegaly or masses appreciated.  Lower extremities without any significant edema.  Peripheral pulses palpable, intact. SKIN:  With multiple subcutaneous hemorrhages and lacerations after falls.  Overall, the skin is very dry and paper thin, shiny, very fragile. NEUROLOGIC:  The patient is alert and oriented x3.   She speaks with the use of assisted device.  Cranial nerves II through XII intact.  No focal abnormalities seen.  LABORATORY DATA:  CBC showed a white blood cells count 4.5, hemoglobin 10.9, hematocrit 32.2, platelets 246.  Sodium 135, potassium 4.0, chloride 104, CO2 of 25, BUN 22, creatinine 1.24, glucose 95, calcium 9.4.  Chest x-ray showed no acute findings.  Chronic changes compatible with COPD.  The cardiac panel first set revealed CK of 33, CK-MB of 0.7, but troponin-I was elevated at 4.70.  EKG showed normal sinus rhythm. There are some Q-waves in the lower leads II, III and aVF.  IMPRESSION: 1. Syncope with falls.  We will admit the patient to the telemetry     unit and monitor for dysrhythmias.  Check orthostatic blood pressure to     rule out orthostatic drop causing syncope.  Also, monitor blood     pressure to rule out hypotension as a causative factor.  The     patient will have head CT with contrast to rule out organic changes     and metastasis. 2. Coronary artery disease with percutaneous coronary intervention in     the past.  The patient has abnormal troponins.  [facility]     - Dr. Eden Emms contacted, and he felt that this elevated troponin not     necessarily is related to the ischemic episode in the setting of     normal CK and CK-MB.  He advised to continue cyclic enzymes and     monitor his CK-MB fractionz for changes if any and that could be     more indicative of coronary artery disease.  The patient's family     and the patient himself would like to be transferred to the [facility] if he is really having another onset of acute     ischemia. 3. Laryngeal carcinoma, recently had chemo and radiation therapy. 4. Hypothyroidism.  We will check TSH and adjust the dose if     necessary.  Case discussed with Dr. Nedra Hai and he agreed with the above.     Raymon Mutton, P.A.   ______________________________ Houston Siren, MD    MK/MEDQ   D:  03/04/2010  T:  03/04/2010  Job:  [id number]  cc:   Dr. Nedra Hai  Electronically Signed by Raymon Mutton P.A. on 04/20/2010 08:17:48 PM Electronically Signed by Houston Siren  on 04/24/2010 09:26:52 PM

## 2010-05-17 ENCOUNTER — Encounter (HOSPITAL_COMMUNITY)
Admission: RE | Admit: 2010-05-17 | Discharge: 2010-05-17 | Disposition: A | Discharge status: Home / Self Care | Payer: MEDICARE | Source: Ambulatory Visit | Attending: Oncology | Admitting: Oncology

## 2010-05-17 ENCOUNTER — Encounter (HOSPITAL_COMMUNITY): Payer: Self-pay

## 2010-05-17 DIAGNOSIS — Z79899 Other long term (current) drug therapy: Secondary | ICD-10-CM | POA: Insufficient documentation

## 2010-05-17 DIAGNOSIS — C329 Malignant neoplasm of larynx, unspecified: Secondary | ICD-10-CM | POA: Insufficient documentation

## 2010-05-17 HISTORY — DX: Malignant neoplasm of larynx, unspecified: C32.9

## 2010-05-17 HISTORY — DX: Malignant neoplasm of colon, unspecified: C18.9

## 2010-05-17 LAB — GLUCOSE, CAPILLARY: Glucose-Capillary: 102 mg/dL — ABNORMAL HIGH (ref 70–99)

## 2010-05-17 MED ORDER — FLUDEOXYGLUCOSE F - 18 (FDG) INJECTION
17.1000 | Freq: Once | INTRAVENOUS | Status: AC | PRN
  Administered 2010-05-17: 17.1 via INTRAVENOUS

## 2010-05-18 ENCOUNTER — Other Ambulatory Visit: Payer: Self-pay | Admitting: Oncology

## 2010-05-18 ENCOUNTER — Encounter (HOSPITAL_COMMUNITY)
Admission: RE | Admit: 2010-05-18 | Discharge: 2010-05-18 | Disposition: A | Discharge status: Home / Self Care | Payer: Medicare Other | Source: Ambulatory Visit | Attending: Oncology | Admitting: Oncology

## 2010-05-18 ENCOUNTER — Encounter (HOSPITAL_BASED_OUTPATIENT_CLINIC_OR_DEPARTMENT_OTHER): Payer: MEDICARE | Admitting: Oncology

## 2010-05-18 DIAGNOSIS — C329 Malignant neoplasm of larynx, unspecified: Secondary | ICD-10-CM

## 2010-05-18 DIAGNOSIS — D649 Anemia, unspecified: Secondary | ICD-10-CM | POA: Insufficient documentation

## 2010-05-18 DIAGNOSIS — E038 Other specified hypothyroidism: Secondary | ICD-10-CM

## 2010-05-18 LAB — COMPREHENSIVE METABOLIC PANEL
Alkaline Phosphatase: 65 U/L (ref 39–117)
Glucose, Bld: 113 mg/dL — ABNORMAL HIGH (ref 70–99)
Sodium: 145 mEq/L (ref 135–145)
Total Bilirubin: 0.5 mg/dL (ref 0.3–1.2)
Total Protein: 5.8 g/dL — ABNORMAL LOW (ref 6.0–8.3)

## 2010-05-18 LAB — CBC WITH DIFFERENTIAL/PLATELET
Eosinophils Absolute: 0.1 10*3/uL (ref 0.0–0.5)
MCV: 107.7 fL — ABNORMAL HIGH (ref 79.3–98.0)
MONO#: 0.7 10*3/uL (ref 0.1–0.9)
MONO%: 12.1 % (ref 0.0–14.0)
NEUT#: 4.1 10*3/uL (ref 1.5–6.5)
RBC: 2.28 10*6/uL — ABNORMAL LOW (ref 4.20–5.82)
RDW: 21.3 % — ABNORMAL HIGH (ref 11.0–14.6)
WBC: 5.8 10*3/uL (ref 4.0–10.3)
lymph#: 0.9 10*3/uL (ref 0.9–3.3)

## 2010-05-18 LAB — RETICULOCYTES
RBC: 2.39 10*6/uL — ABNORMAL LOW (ref 4.20–5.82)
Retic %: 2.2 % — ABNORMAL HIGH (ref 0.50–1.60)

## 2010-05-19 LAB — TYPE & CROSSMATCH - CHCC

## 2010-05-20 ENCOUNTER — Other Ambulatory Visit: Payer: Self-pay | Admitting: Oncology

## 2010-05-20 ENCOUNTER — Encounter (HOSPITAL_BASED_OUTPATIENT_CLINIC_OR_DEPARTMENT_OTHER): Payer: MEDICARE | Admitting: Oncology

## 2010-05-20 DIAGNOSIS — D509 Iron deficiency anemia, unspecified: Secondary | ICD-10-CM

## 2010-05-20 DIAGNOSIS — C329 Malignant neoplasm of larynx, unspecified: Secondary | ICD-10-CM

## 2010-05-20 LAB — CROSSMATCH
ABO/RH(D): A NEG
Antibody Screen: NEGATIVE

## 2010-05-20 LAB — PROTEIN ELECTROPHORESIS, SERUM
Alpha-2-Globulin: 14.7 % — ABNORMAL HIGH (ref 7.1–11.8)
Gamma Globulin: 12.4 % (ref 11.1–18.8)
Total Protein, Serum Electrophoresis: 6.2 g/dL (ref 6.0–8.3)

## 2010-05-20 LAB — IRON AND TIBC
%SAT: 32 % (ref 20–55)
Iron: 76 ug/dL (ref 42–165)

## 2010-05-20 LAB — TYPE & CROSSMATCH - CHCC

## 2010-05-20 LAB — FERRITIN: Ferritin: 676 ng/mL — ABNORMAL HIGH (ref 22–322)

## 2010-05-20 LAB — ERYTHROPOIETIN: Erythropoietin: 90.2 m[IU]/mL — ABNORMAL HIGH (ref 2.6–34.0)

## 2010-05-20 LAB — VITAMIN B12: Vitamin B-12: 325 pg/mL (ref 211–911)

## 2010-05-20 LAB — LACTATE DEHYDROGENASE: LDH: 186 U/L (ref 94–250)

## 2010-05-21 LAB — CROSSMATCH
ABO/RH(D): A NEG
Antibody Screen: NEGATIVE
Unit division: 0

## 2010-05-21 LAB — CREATININE CLEARANCE, URINE, 24 HOUR
Collection Interval-CRCL: 24 hours
Creatinine: 0.83 mg/dL (ref 0.40–1.50)
Urine Total Volume-CRCL: 2700 mL

## 2010-05-23 ENCOUNTER — Encounter (HOSPITAL_COMMUNITY): Payer: Medicare Other

## 2010-05-27 ENCOUNTER — Ambulatory Visit
Admission: RE | Admit: 2010-05-27 | Discharge: 2010-05-27 | Disposition: A | Discharge status: Home / Self Care | Payer: Medicare Other | Source: Ambulatory Visit | Attending: Radiation Oncology | Admitting: Radiation Oncology

## 2010-05-27 ENCOUNTER — Encounter (HOSPITAL_COMMUNITY): Payer: Medicare Other

## 2010-05-31 ENCOUNTER — Encounter (HOSPITAL_COMMUNITY): Payer: Medicare Other

## 2010-06-02 ENCOUNTER — Other Ambulatory Visit: Payer: Self-pay | Admitting: Oncology

## 2010-06-02 ENCOUNTER — Encounter (HOSPITAL_BASED_OUTPATIENT_CLINIC_OR_DEPARTMENT_OTHER): Payer: Medicare Other | Admitting: Oncology

## 2010-06-02 DIAGNOSIS — C329 Malignant neoplasm of larynx, unspecified: Secondary | ICD-10-CM

## 2010-06-02 LAB — CBC WITH DIFFERENTIAL/PLATELET
BASO%: 0.3 % (ref 0.0–2.0)
Basophils Absolute: 0 10*3/uL (ref 0.0–0.1)
EOS%: 4.6 % (ref 0.0–7.0)
HCT: 31 % — ABNORMAL LOW (ref 38.4–49.9)
LYMPH%: 16.2 % (ref 14.0–49.0)
MCH: 36.3 pg — ABNORMAL HIGH (ref 27.2–33.4)
MCHC: 34.8 g/dL (ref 32.0–36.0)
MCV: 104.5 fL — ABNORMAL HIGH (ref 79.3–98.0)
NEUT%: 64.6 % (ref 39.0–75.0)
Platelets: 350 10*3/uL (ref 140–400)

## 2010-06-17 ENCOUNTER — Other Ambulatory Visit: Payer: Self-pay | Admitting: Oncology

## 2010-06-17 ENCOUNTER — Encounter (HOSPITAL_BASED_OUTPATIENT_CLINIC_OR_DEPARTMENT_OTHER): Payer: Medicare Other | Admitting: Oncology

## 2010-06-17 DIAGNOSIS — C329 Malignant neoplasm of larynx, unspecified: Secondary | ICD-10-CM

## 2010-06-17 LAB — HOLD TUBE, BLOOD BANK

## 2010-06-17 LAB — CBC WITH DIFFERENTIAL/PLATELET
Basophils Absolute: 0 10*3/uL (ref 0.0–0.1)
Eosinophils Absolute: 0.4 10*3/uL (ref 0.0–0.5)
HGB: 11.6 g/dL — ABNORMAL LOW (ref 13.0–17.1)
MONO#: 0.8 10*3/uL (ref 0.1–0.9)
NEUT#: 10.1 10*3/uL — ABNORMAL HIGH (ref 1.5–6.5)
RBC: 3.17 10*6/uL — ABNORMAL LOW (ref 4.20–5.82)
RDW: 20.2 % — ABNORMAL HIGH (ref 11.0–14.6)
WBC: 12.1 10*3/uL — ABNORMAL HIGH (ref 4.0–10.3)

## 2010-06-24 ENCOUNTER — Ambulatory Visit (HOSPITAL_COMMUNITY)
Admission: RE | Admit: 2010-06-24 | Discharge: 2010-06-24 | Disposition: A | Discharge status: Home / Self Care | Payer: Medicare Other | Source: Ambulatory Visit | Attending: Gastroenterology | Admitting: Gastroenterology

## 2010-06-24 ENCOUNTER — Other Ambulatory Visit: Payer: Self-pay | Admitting: Gastroenterology

## 2010-06-24 DIAGNOSIS — D126 Benign neoplasm of colon, unspecified: Secondary | ICD-10-CM | POA: Insufficient documentation

## 2010-06-24 DIAGNOSIS — E785 Hyperlipidemia, unspecified: Secondary | ICD-10-CM | POA: Insufficient documentation

## 2010-06-24 DIAGNOSIS — I10 Essential (primary) hypertension: Secondary | ICD-10-CM | POA: Insufficient documentation

## 2010-06-24 DIAGNOSIS — I2510 Atherosclerotic heart disease of native coronary artery without angina pectoris: Secondary | ICD-10-CM | POA: Insufficient documentation

## 2010-06-24 DIAGNOSIS — Z7982 Long term (current) use of aspirin: Secondary | ICD-10-CM | POA: Insufficient documentation

## 2010-06-24 DIAGNOSIS — Z85038 Personal history of other malignant neoplasm of large intestine: Secondary | ICD-10-CM | POA: Insufficient documentation

## 2010-06-24 DIAGNOSIS — K5730 Diverticulosis of large intestine without perforation or abscess without bleeding: Secondary | ICD-10-CM | POA: Insufficient documentation

## 2010-06-24 DIAGNOSIS — Z8521 Personal history of malignant neoplasm of larynx: Secondary | ICD-10-CM | POA: Insufficient documentation

## 2010-06-24 DIAGNOSIS — K648 Other hemorrhoids: Secondary | ICD-10-CM | POA: Insufficient documentation

## 2010-06-24 DIAGNOSIS — E039 Hypothyroidism, unspecified: Secondary | ICD-10-CM | POA: Insufficient documentation

## 2010-06-24 DIAGNOSIS — K644 Residual hemorrhoidal skin tags: Secondary | ICD-10-CM | POA: Insufficient documentation

## 2010-06-24 DIAGNOSIS — Z9049 Acquired absence of other specified parts of digestive tract: Secondary | ICD-10-CM | POA: Insufficient documentation

## 2010-06-24 DIAGNOSIS — IMO0002 Reserved for concepts with insufficient information to code with codable children: Secondary | ICD-10-CM | POA: Insufficient documentation

## 2010-06-24 DIAGNOSIS — D129 Benign neoplasm of anus and anal canal: Secondary | ICD-10-CM | POA: Insufficient documentation

## 2010-06-24 DIAGNOSIS — D128 Benign neoplasm of rectum: Secondary | ICD-10-CM | POA: Insufficient documentation

## 2010-06-24 DIAGNOSIS — Z79899 Other long term (current) drug therapy: Secondary | ICD-10-CM | POA: Insufficient documentation

## 2010-07-01 ENCOUNTER — Other Ambulatory Visit: Payer: Self-pay | Admitting: Oncology

## 2010-07-01 ENCOUNTER — Encounter (HOSPITAL_BASED_OUTPATIENT_CLINIC_OR_DEPARTMENT_OTHER): Payer: Medicare Other | Admitting: Oncology

## 2010-07-01 DIAGNOSIS — C329 Malignant neoplasm of larynx, unspecified: Secondary | ICD-10-CM

## 2010-07-01 LAB — CBC WITH DIFFERENTIAL/PLATELET
BASO%: 0.1 % (ref 0.0–2.0)
HCT: 30.7 % — ABNORMAL LOW (ref 38.4–49.9)
MCHC: 34.4 g/dL (ref 32.0–36.0)
MONO#: 0.7 10*3/uL (ref 0.1–0.9)
NEUT%: 65 % (ref 39.0–75.0)
WBC: 5.5 10*3/uL (ref 4.0–10.3)
lymph#: 0.8 10*3/uL — ABNORMAL LOW (ref 0.9–3.3)

## 2010-07-01 LAB — HOLD TUBE, BLOOD BANK

## 2010-07-15 ENCOUNTER — Other Ambulatory Visit: Payer: Self-pay | Admitting: Oncology

## 2010-07-15 ENCOUNTER — Encounter (HOSPITAL_BASED_OUTPATIENT_CLINIC_OR_DEPARTMENT_OTHER): Payer: Medicare Other | Admitting: Oncology

## 2010-07-15 DIAGNOSIS — C329 Malignant neoplasm of larynx, unspecified: Secondary | ICD-10-CM

## 2010-07-15 LAB — CBC WITH DIFFERENTIAL/PLATELET
Basophils Absolute: 0 10*3/uL (ref 0.0–0.1)
Eosinophils Absolute: 0.2 10*3/uL (ref 0.0–0.5)
HGB: 10.1 g/dL — ABNORMAL LOW (ref 13.0–17.1)
MCV: 108.3 fL — ABNORMAL HIGH (ref 79.3–98.0)
MONO#: 0.6 10*3/uL (ref 0.1–0.9)
NEUT#: 4.3 10*3/uL (ref 1.5–6.5)
RDW: 16.8 % — ABNORMAL HIGH (ref 11.0–14.6)
WBC: 6.2 10*3/uL (ref 4.0–10.3)
lymph#: 1 10*3/uL (ref 0.9–3.3)

## 2010-07-29 ENCOUNTER — Other Ambulatory Visit: Payer: Self-pay | Admitting: Oncology

## 2010-07-29 ENCOUNTER — Encounter (HOSPITAL_BASED_OUTPATIENT_CLINIC_OR_DEPARTMENT_OTHER): Payer: Medicare Other | Admitting: Oncology

## 2010-07-29 DIAGNOSIS — C329 Malignant neoplasm of larynx, unspecified: Secondary | ICD-10-CM

## 2010-07-29 LAB — CBC WITH DIFFERENTIAL/PLATELET
Eosinophils Absolute: 0.3 10*3/uL (ref 0.0–0.5)
HCT: 31.1 % — ABNORMAL LOW (ref 38.4–49.9)
HGB: 10.5 g/dL — ABNORMAL LOW (ref 13.0–17.1)
LYMPH%: 16.8 % (ref 14.0–49.0)
MONO#: 0.7 10*3/uL (ref 0.1–0.9)
NEUT#: 4.9 10*3/uL (ref 1.5–6.5)
NEUT%: 69.4 % (ref 39.0–75.0)
Platelets: 283 10*3/uL (ref 140–400)
WBC: 7 10*3/uL (ref 4.0–10.3)
lymph#: 1.2 10*3/uL (ref 0.9–3.3)

## 2010-09-30 ENCOUNTER — Ambulatory Visit: Payer: Medicare Other | Attending: Radiation Oncology | Admitting: Radiation Oncology

## 2010-10-15 ENCOUNTER — Encounter: Payer: Self-pay | Admitting: Oncology

## 2010-10-15 DIAGNOSIS — C329 Malignant neoplasm of larynx, unspecified: Secondary | ICD-10-CM | POA: Insufficient documentation

## 2010-11-04 ENCOUNTER — Encounter: Payer: Self-pay | Admitting: *Deleted

## 2010-11-15 ENCOUNTER — Other Ambulatory Visit: Payer: Medicare Other | Admitting: Lab

## 2010-11-15 ENCOUNTER — Other Ambulatory Visit (HOSPITAL_COMMUNITY): Payer: MEDICARE

## 2010-11-16 ENCOUNTER — Ambulatory Visit: Payer: Medicare Other | Admitting: Oncology

## 2010-12-02 ENCOUNTER — Telehealth: Payer: Self-pay | Admitting: *Deleted

## 2010-12-02 NOTE — Telephone Encounter (Signed)
Sister called to request Dr. Gaylyn Rong release or rescind [name]'s orders for tests or scans.  [name] being seen currently by Dr. Jacqulyn Bath at [facility] [phone]) and [name]'s insurance company [facility]) saying they won't pay for CT scans or other tests done there because they had previously been pre approved for Dr. Gaylyn Rong?  This RN cannot find any pending or upcoming tests ordered by Dr. Gaylyn Rong.    [name] not being followed by Dr. Gaylyn Rong a this time.  Sister states [facility] won't pay for tests done at [facility] until we "release" tests ordered here.  Note routed to [employer] to follow up.  Case #[id number]. Ph [phone].

## 2010-12-31 IMAGING — CT CT HEAD W/O CM
1 series · 16 of 30 positions shown, 20 images · non-contrast
Comparison: None

CLINICAL DATA: Laryngeal cancer.  Headache.

CT HEAD WITHOUT CONTRAST
TECHNIQUE: Contiguous axial images were obtained from the base of
the skull through the vertex without contrast.

[Series 2: (id) head 4.8 h37s st · axial · 0.49mm/px · z∈[+1024,+1179]mm · 16 of 36 slices shown, 20 images]
[im 2/36  brain]
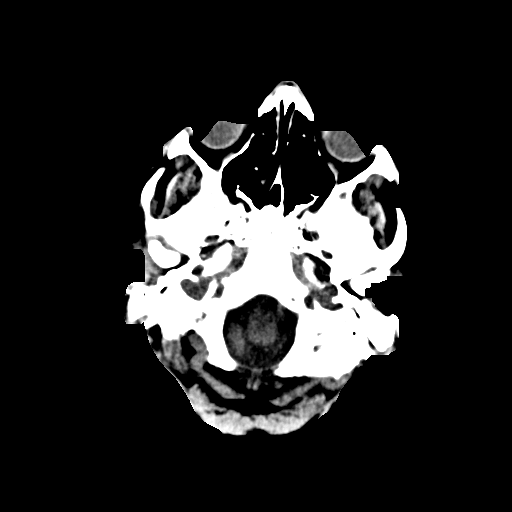
[im 2/36  bone]
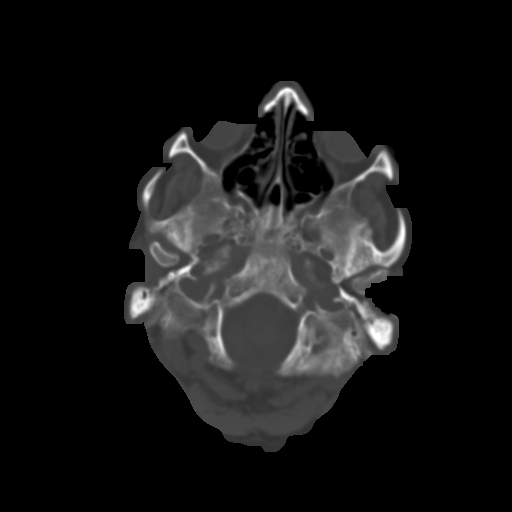
[im 4/36  brain]
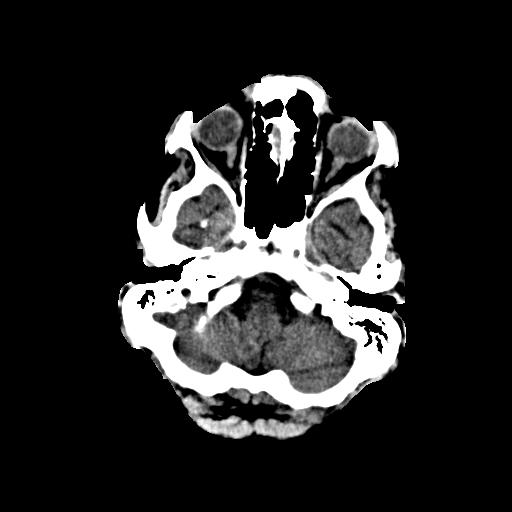
[im 7/36  brain]
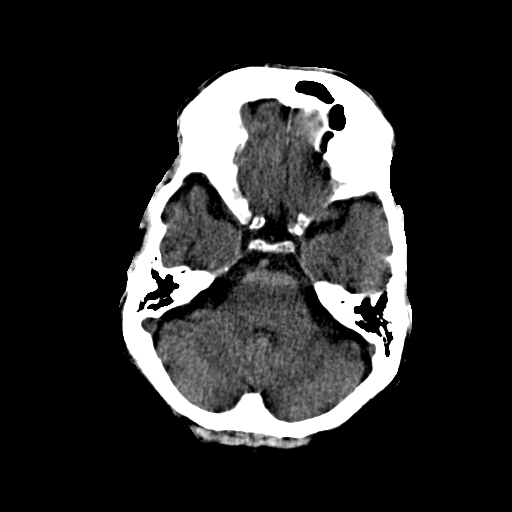
[im 9/36  brain]
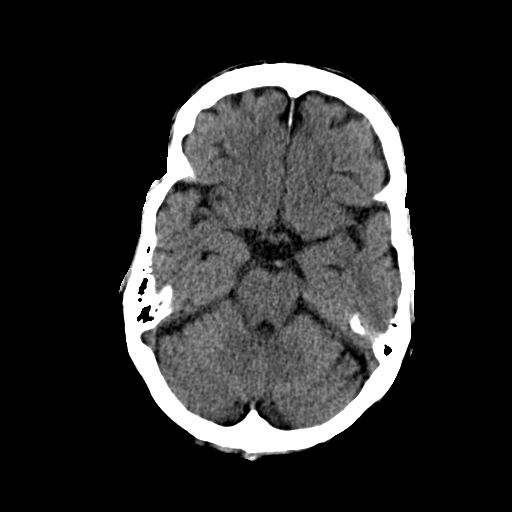
[im 10/36  brain]
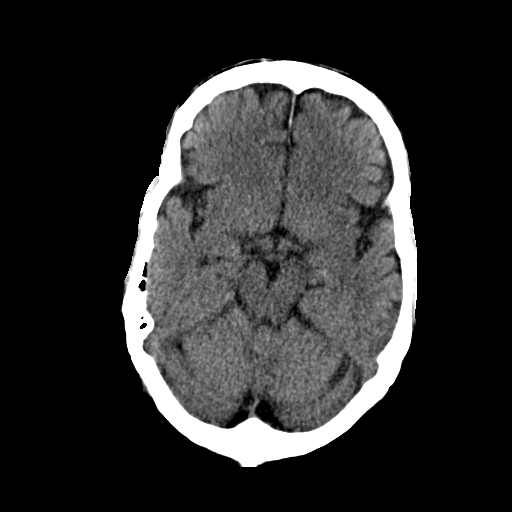
[im 10/36  bone]
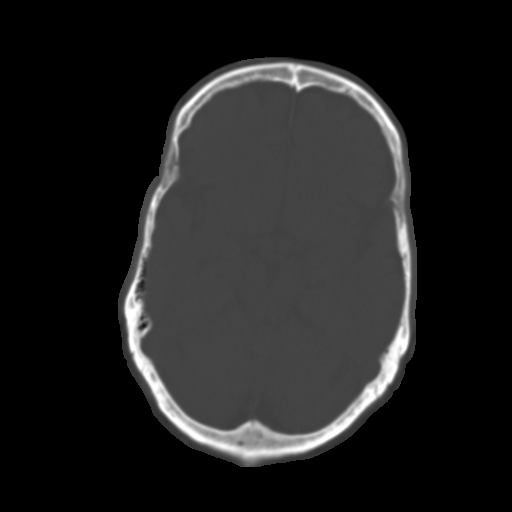
[im 13/36  brain]
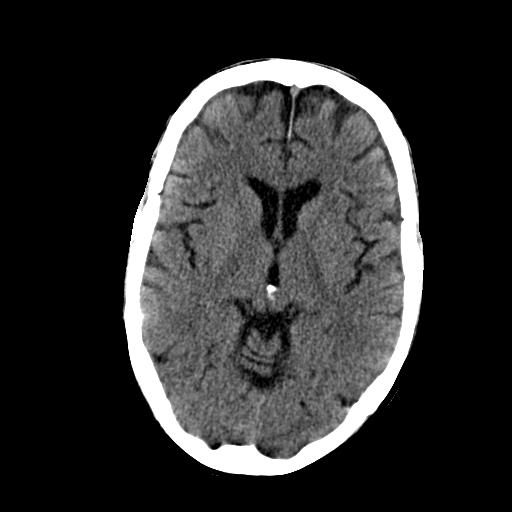
[im 15/36  brain]
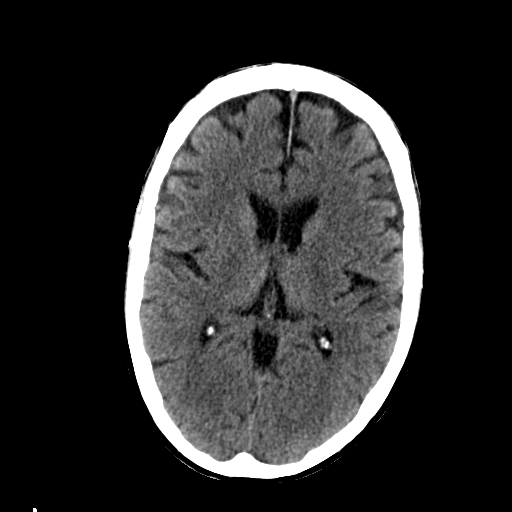
[im 17/36  brain]
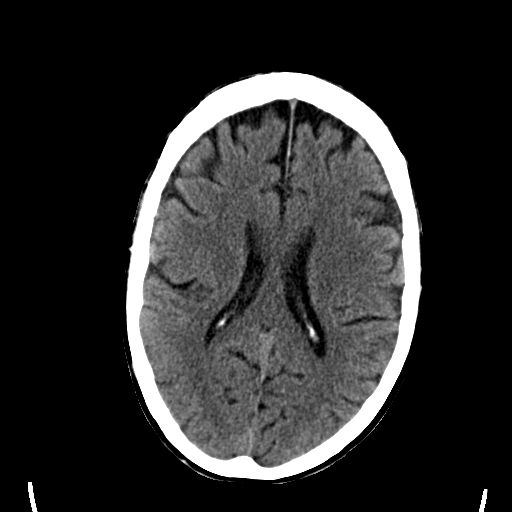
[im 19/36  brain]
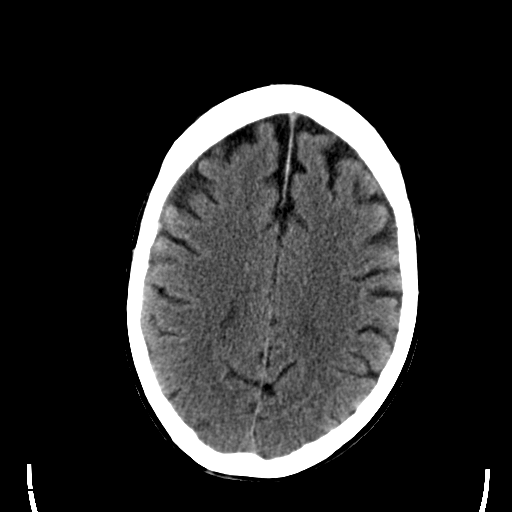
[im 19/36  bone]
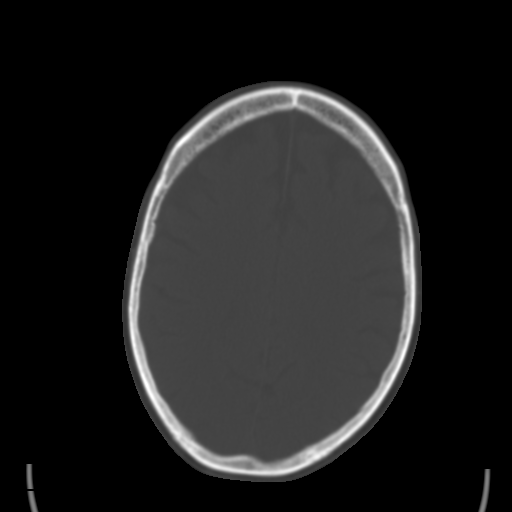
[im 21/36  brain]
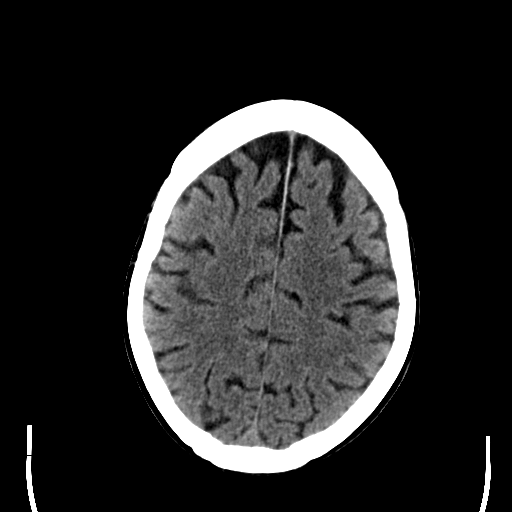
[im 23/36  brain]
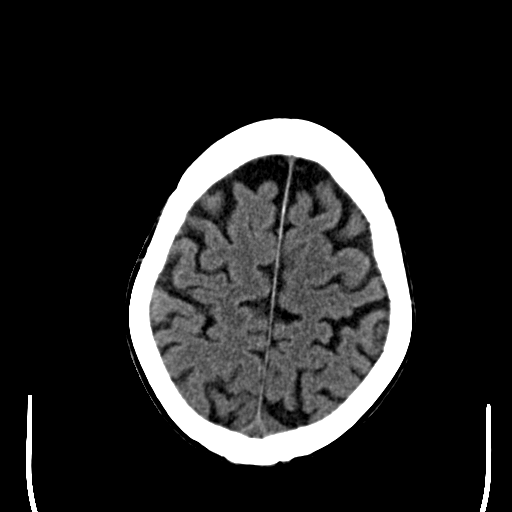
[im 26/36  brain]
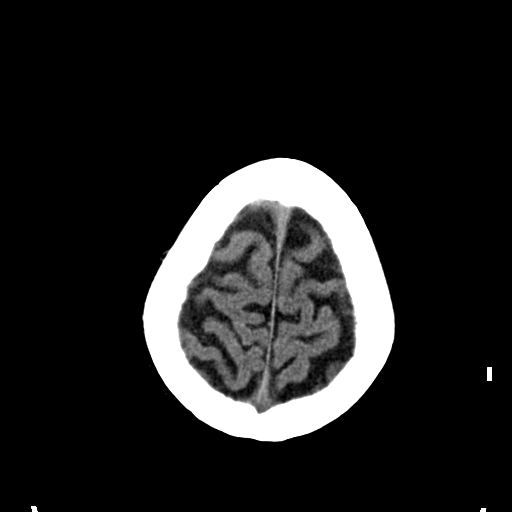
[im 27/36  brain]
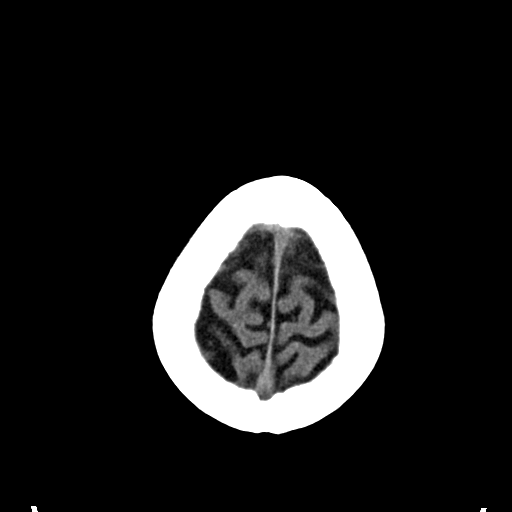
[im 27/36  bone]
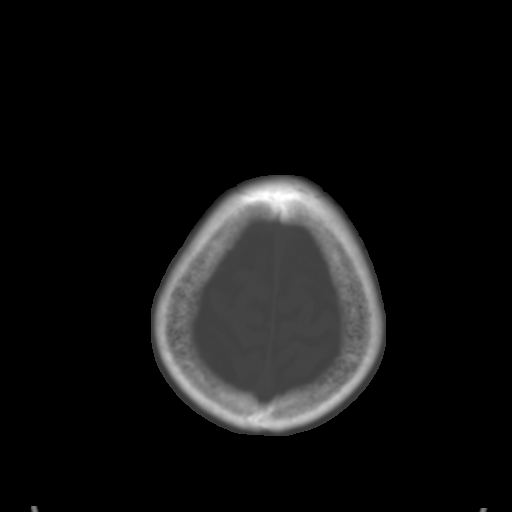
[im 29/36  brain]
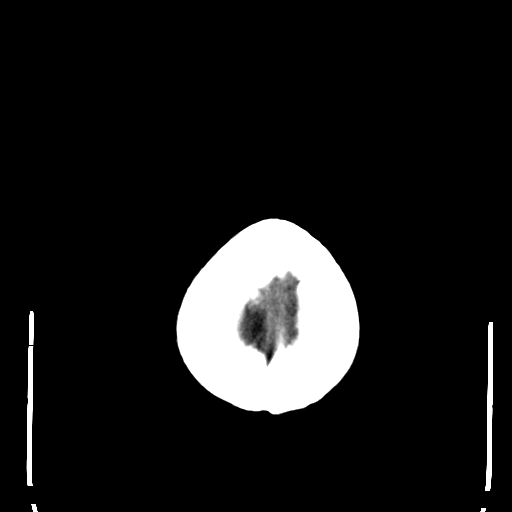
[im 32/36  brain]
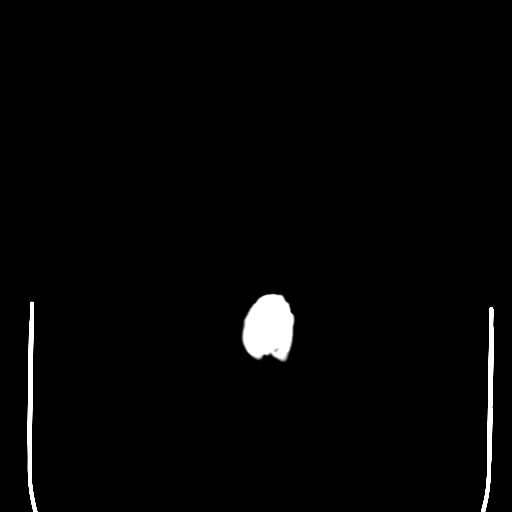
[im 34/36  brain]
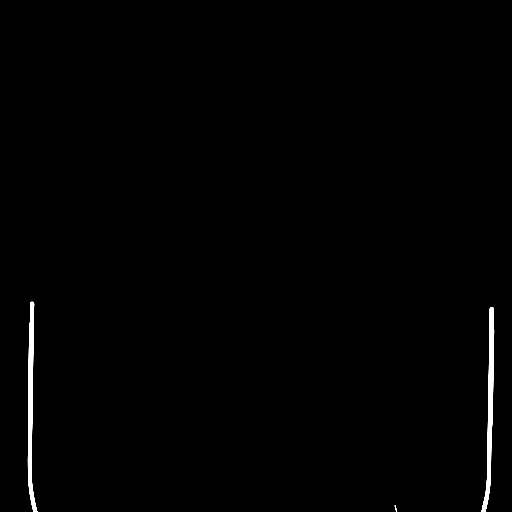

[16 of 30 positions shown; findings below may reference images not displayed]

FINDINGS: There is mild, age appropriate atrophy.  Negative for
intracranial hemorrhage.  There is no mass or edema.  The patient
did not receive intravenous contrast but no mass lesion is seen on
the unenhanced images.  No acute infarct.  The calvarium is intact.
IMPRESSION: No acute abnormality.

## 2011-01-21 IMAGING — CR DG ABDOMEN 1V
1 series · 1 of 1 positions shown · non-contrast
Comparison: None

CLINICAL DATA: Feeding tube placement.  Evaluate location.

ABDOMEN - 1 VIEW

[t abdomen supine]
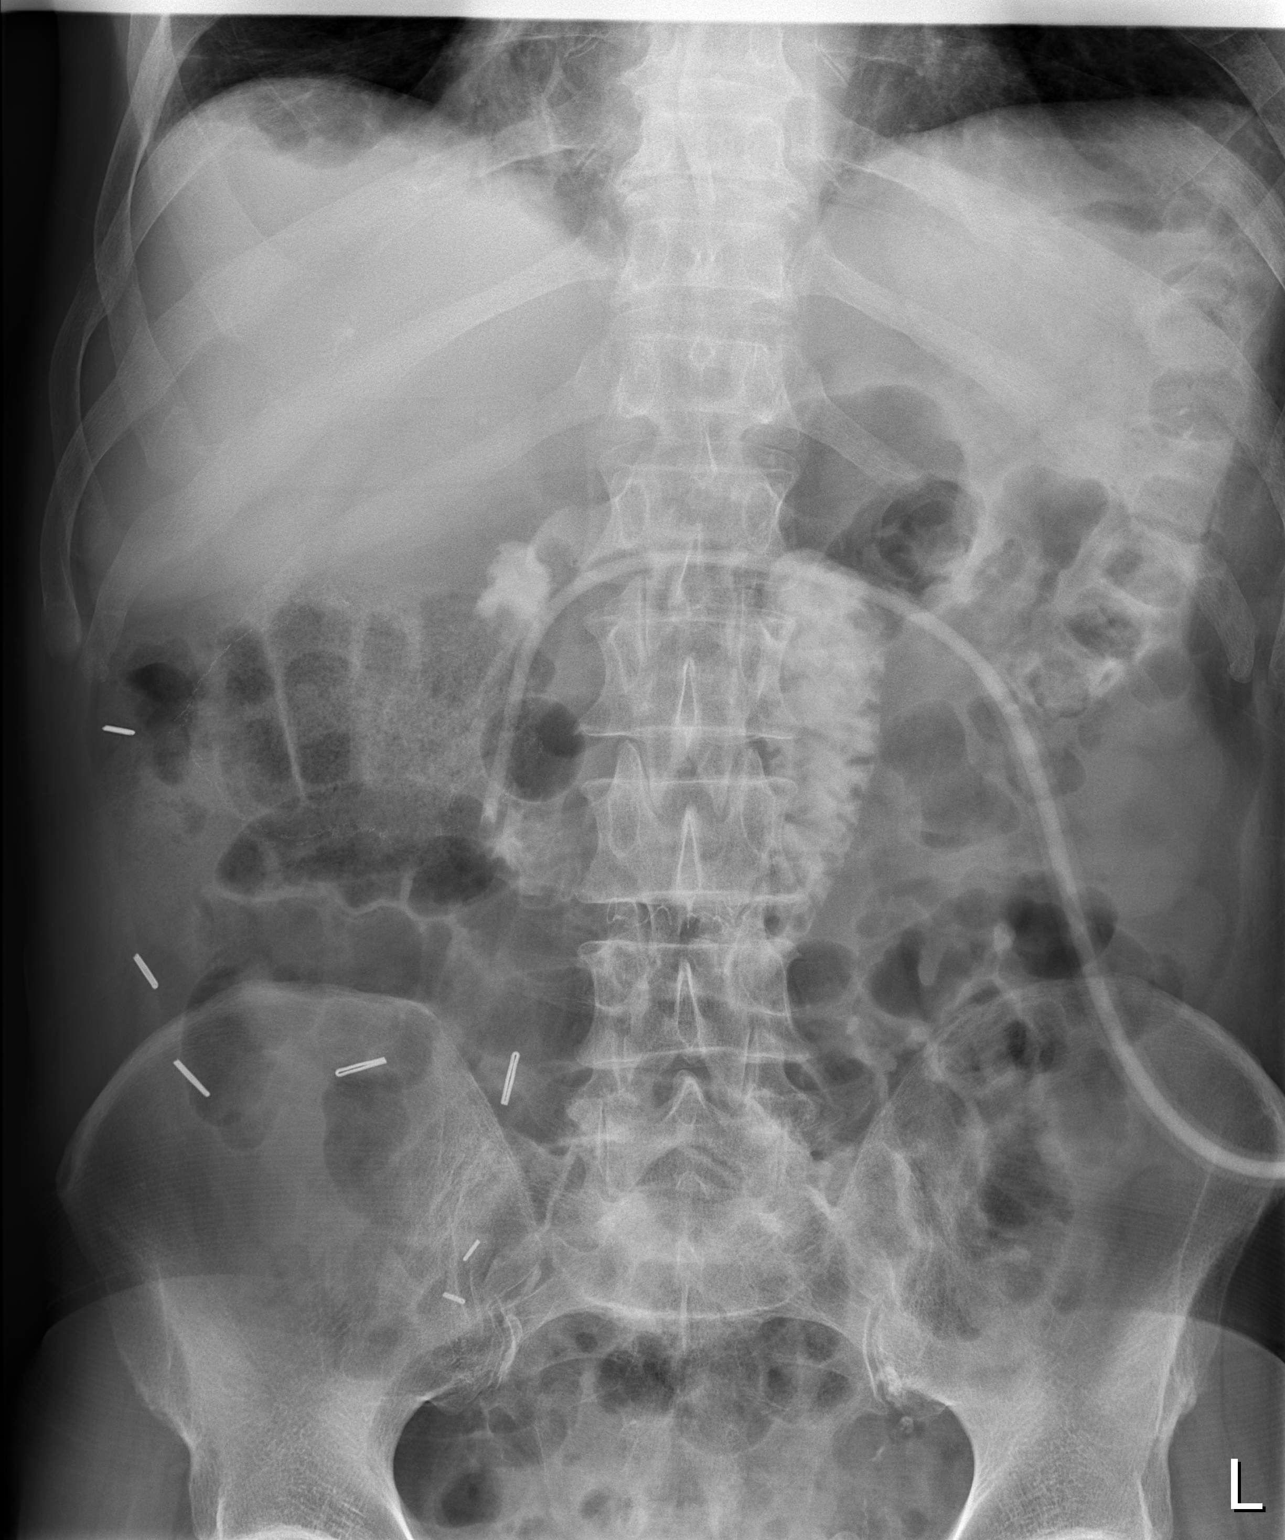

[1 of 1 positions shown; findings below may reference images not displayed]

FINDINGS: 30 ml of gastrographin was injected the patient's feeding
tube to evaluate tip position.

Contrast is identified within the small bowel.
No extraluminal contrast is identified.
The bowel gas pattern is nonspecific without evidence of bowel
obstruction.
IMPRESSION: Feeding tube tip within the small bowel.

Nonspecific nonobstructive bowel gas pattern.

## 2011-12-13 IMAGING — CT CT HEAD W/O CM
1 series · 16 of 30 positions shown, 20 images · non-contrast
Comparison: 03/22/2009.

CLINICAL DATA: 64-year-old male with weakness.  Recent treatment
for laryngeal carcinoma.  Syncope.

CT HEAD WITHOUT CONTRAST
TECHNIQUE: Contiguous axial images were obtained from the base of
the skull through the vertex without contrast.

[Series 2: headseq 4.8 h45s · axial · 0.43mm/px · z∈[-136,-6]mm · 16 of 30 slices shown, 20 images]
[im 2/30  brain]
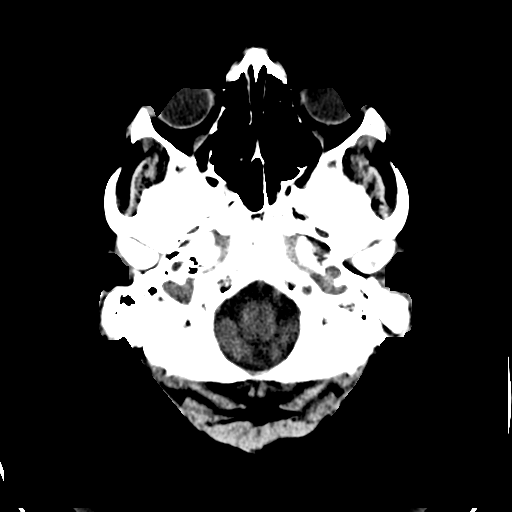
[im 2/30  bone]
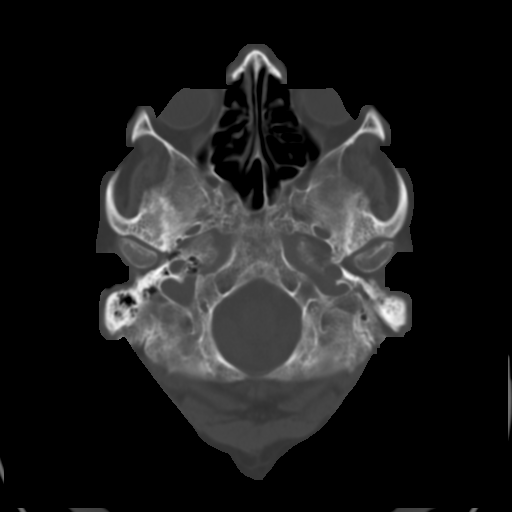
[im 4/30  brain]
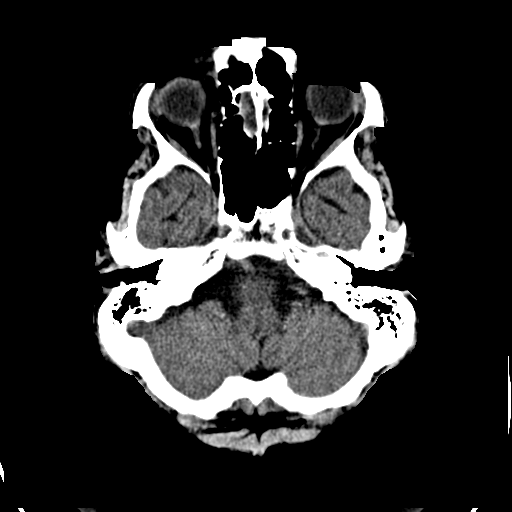
[im 6/30  brain]
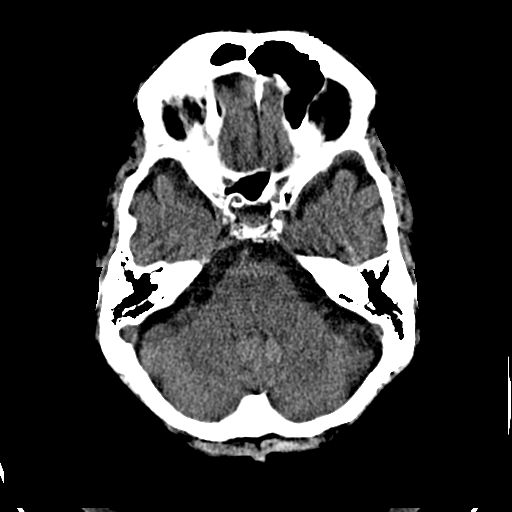
[im 8/30  brain]
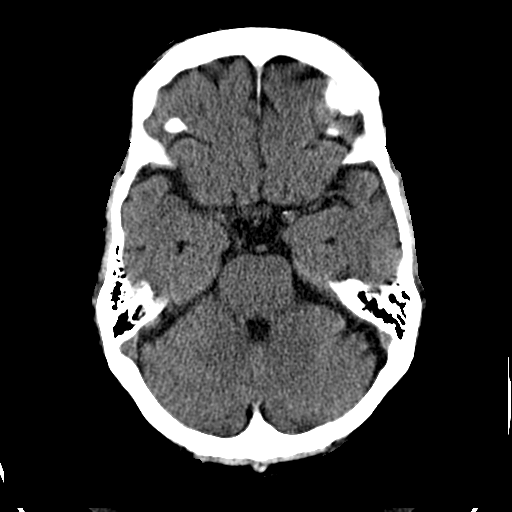
[im 9/30  brain]
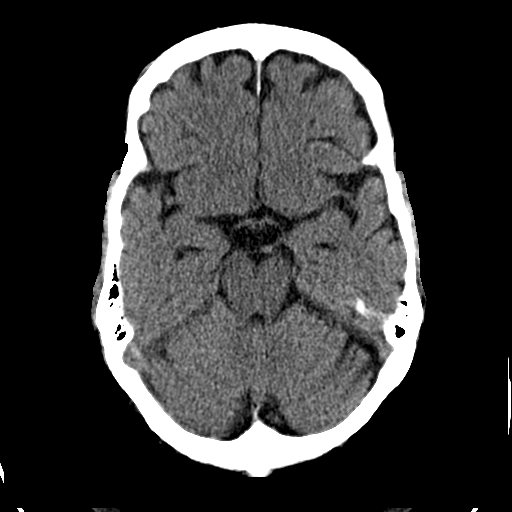
[im 9/30  bone]
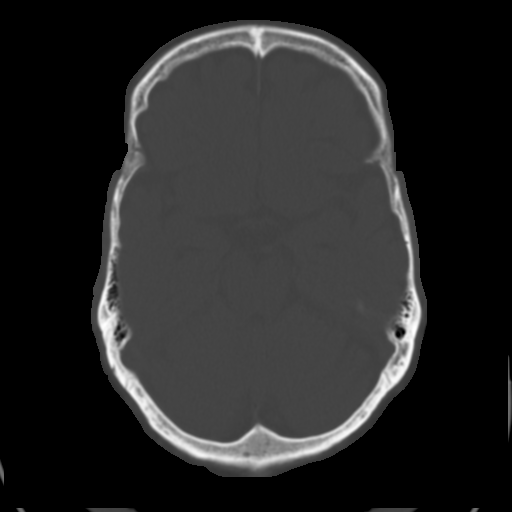
[im 11/30  brain]
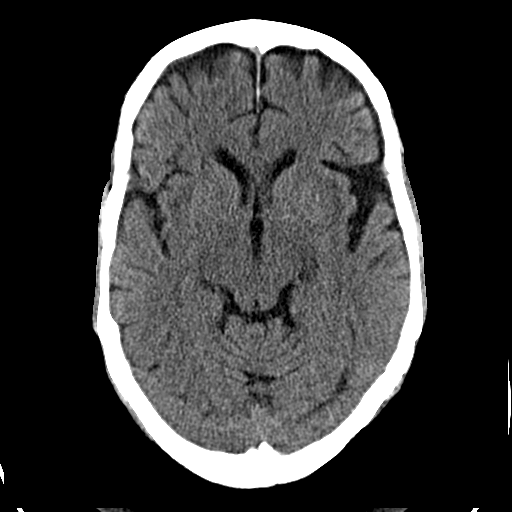
[im 13/30  brain]
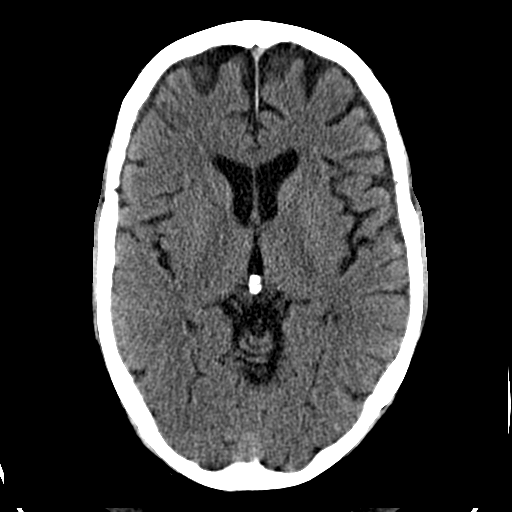
[im 15/30  brain]
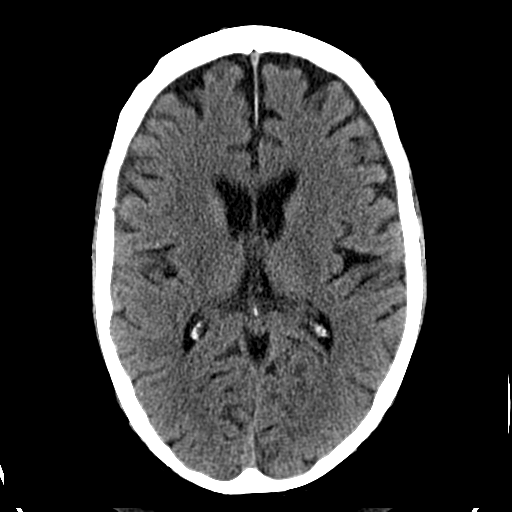
[im 16/30  brain]
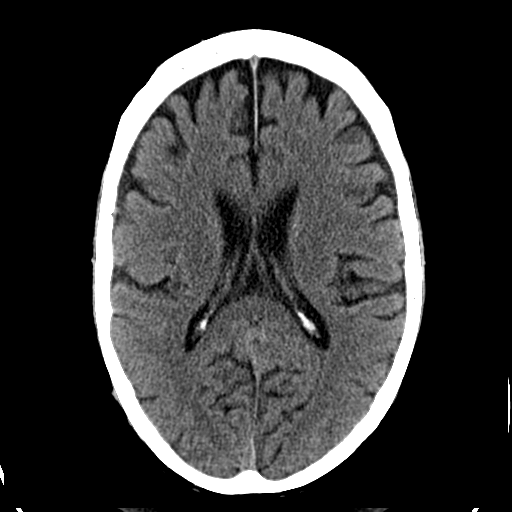
[im 16/30  bone]
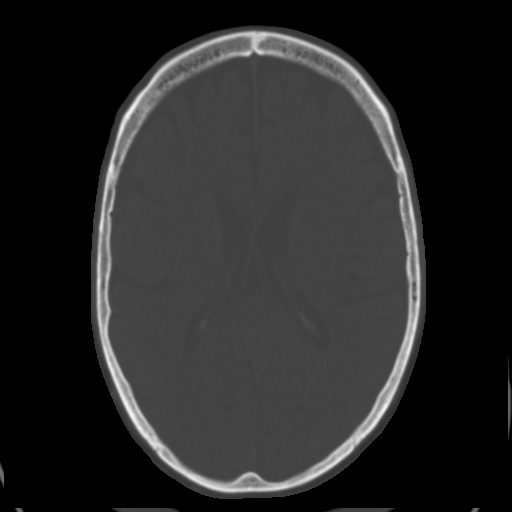
[im 18/30  brain]
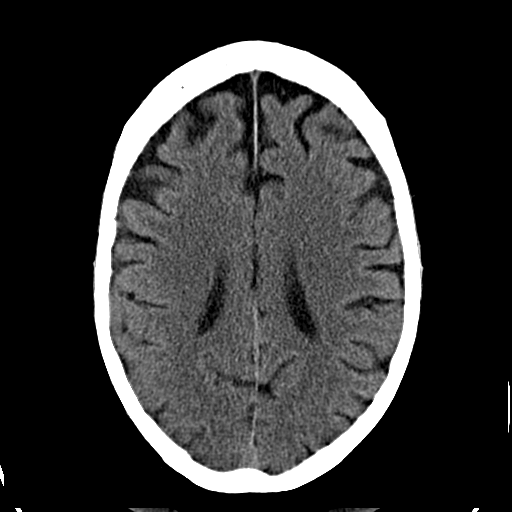
[im 20/30  brain]
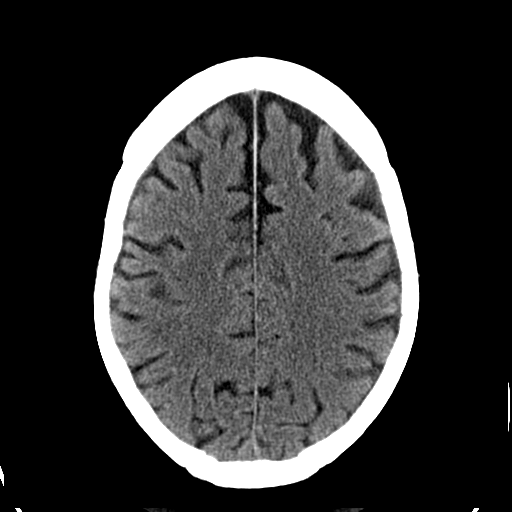
[im 22/30  brain]
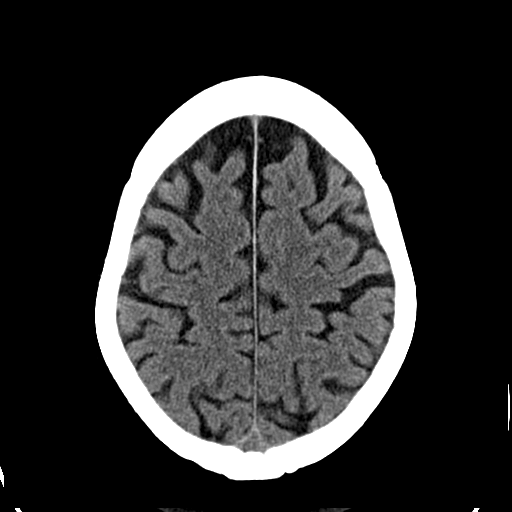
[im 23/30  brain]
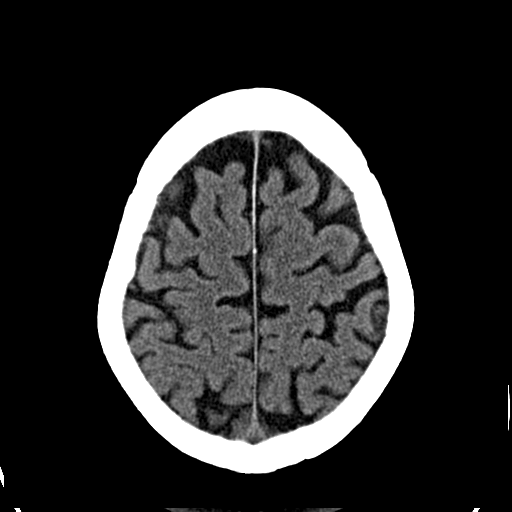
[im 23/30  bone]
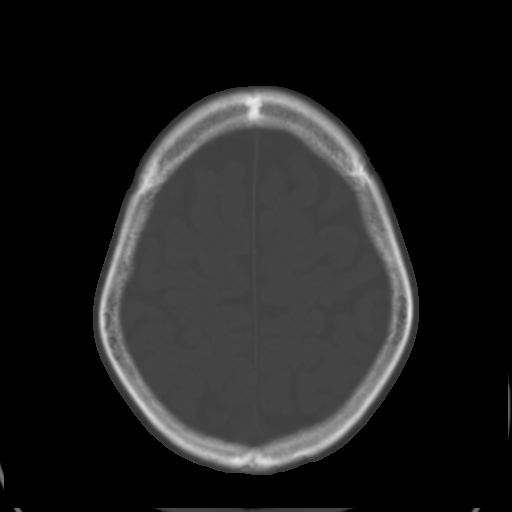
[im 25/30  brain]
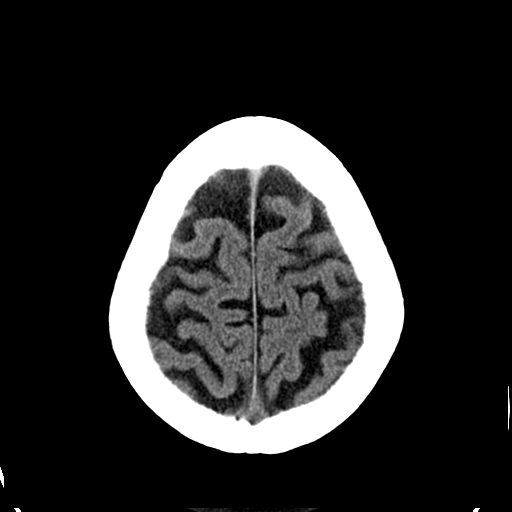
[im 27/30  brain]
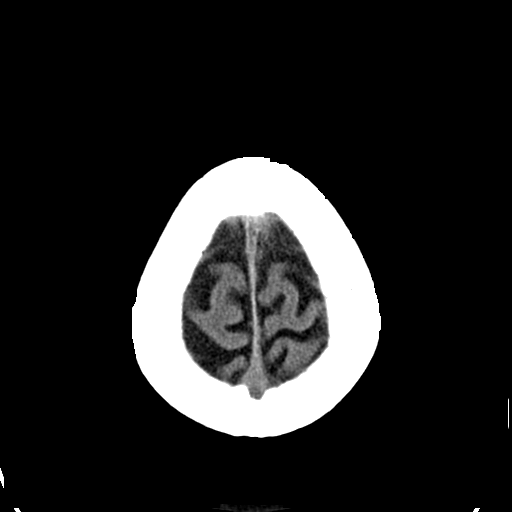
[im 29/30  brain]
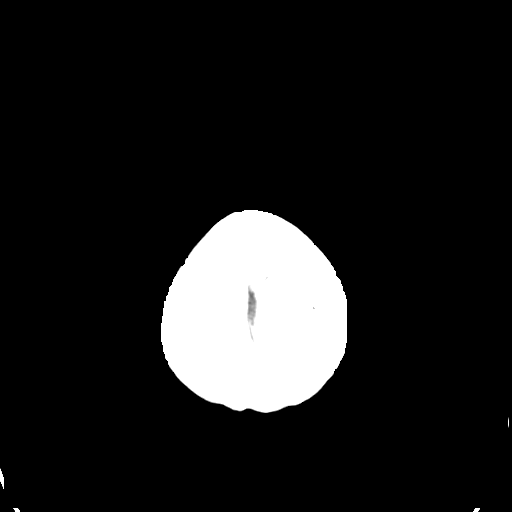

[16 of 30 positions shown; findings below may reference images not displayed]

FINDINGS: Visualized orbits and scalp soft tissues are within
normal limits.  Mild Calcified atherosclerosis at the skull base.
Visualized paranasal sinuses and mastoids are clear.  Osteopenia.
No acute osseous abnormality identified.

Cerebral volume is within normal limits for age.  No midline shift,
ventriculomegaly, mass effect, evidence of mass lesion,
intracranial hemorrhage or evidence of cortically based acute
infarction.  Gray-white matter differentiation is within normal
limits throughout the brain.  Dominant left vertebral artery re-
identified. No suspicious intracranial vascular hyperdensity.
IMPRESSION: Stable, normal noncontrast CT appearance of the brain for age.

## 2016-09-02 ENCOUNTER — Encounter (HOSPITAL_COMMUNITY): Payer: Self-pay | Admitting: *Deleted

## 2016-09-02 ENCOUNTER — Inpatient Hospital Stay (HOSPITAL_COMMUNITY)
Admission: EM | Admit: 2016-09-02 | Discharge: 2016-09-09 | DRG: 291 | Disposition: E | Payer: Medicare Other | Attending: Pulmonary Disease | Admitting: Pulmonary Disease

## 2016-09-02 ENCOUNTER — Emergency Department (HOSPITAL_COMMUNITY): Payer: Medicare Other

## 2016-09-02 DIAGNOSIS — I4901 Ventricular fibrillation: Secondary | ICD-10-CM | POA: Diagnosis present

## 2016-09-02 DIAGNOSIS — Z955 Presence of coronary angioplasty implant and graft: Secondary | ICD-10-CM | POA: Diagnosis not present

## 2016-09-02 DIAGNOSIS — Z85038 Personal history of other malignant neoplasm of large intestine: Secondary | ICD-10-CM | POA: Diagnosis not present

## 2016-09-02 DIAGNOSIS — Z7982 Long term (current) use of aspirin: Secondary | ICD-10-CM

## 2016-09-02 DIAGNOSIS — Z66 Do not resuscitate: Secondary | ICD-10-CM | POA: Diagnosis not present

## 2016-09-02 DIAGNOSIS — J9602 Acute respiratory failure with hypercapnia: Secondary | ICD-10-CM | POA: Diagnosis present

## 2016-09-02 DIAGNOSIS — G931 Anoxic brain damage, not elsewhere classified: Secondary | ICD-10-CM | POA: Diagnosis present

## 2016-09-02 DIAGNOSIS — J9311 Primary spontaneous pneumothorax: Secondary | ICD-10-CM

## 2016-09-02 DIAGNOSIS — Z930 Tracheostomy status: Secondary | ICD-10-CM

## 2016-09-02 DIAGNOSIS — I469 Cardiac arrest, cause unspecified: Secondary | ICD-10-CM

## 2016-09-02 DIAGNOSIS — Z79899 Other long term (current) drug therapy: Secondary | ICD-10-CM

## 2016-09-02 DIAGNOSIS — C3412 Malignant neoplasm of upper lobe, left bronchus or lung: Secondary | ICD-10-CM | POA: Diagnosis present

## 2016-09-02 DIAGNOSIS — Z79891 Long term (current) use of opiate analgesic: Secondary | ICD-10-CM | POA: Diagnosis not present

## 2016-09-02 DIAGNOSIS — R570 Cardiogenic shock: Secondary | ICD-10-CM | POA: Diagnosis present

## 2016-09-02 DIAGNOSIS — Z886 Allergy status to analgesic agent status: Secondary | ICD-10-CM

## 2016-09-02 DIAGNOSIS — C3492 Malignant neoplasm of unspecified part of left bronchus or lung: Secondary | ICD-10-CM | POA: Diagnosis present

## 2016-09-02 DIAGNOSIS — Z7902 Long term (current) use of antithrombotics/antiplatelets: Secondary | ICD-10-CM | POA: Diagnosis not present

## 2016-09-02 DIAGNOSIS — Z9689 Presence of other specified functional implants: Secondary | ICD-10-CM

## 2016-09-02 DIAGNOSIS — Z87891 Personal history of nicotine dependence: Secondary | ICD-10-CM

## 2016-09-02 DIAGNOSIS — Z515 Encounter for palliative care: Secondary | ICD-10-CM | POA: Diagnosis present

## 2016-09-02 DIAGNOSIS — J449 Chronic obstructive pulmonary disease, unspecified: Secondary | ICD-10-CM | POA: Diagnosis present

## 2016-09-02 DIAGNOSIS — J930 Spontaneous tension pneumothorax: Secondary | ICD-10-CM | POA: Diagnosis present

## 2016-09-02 DIAGNOSIS — J9601 Acute respiratory failure with hypoxia: Secondary | ICD-10-CM | POA: Diagnosis present

## 2016-09-02 DIAGNOSIS — Z8521 Personal history of malignant neoplasm of larynx: Secondary | ICD-10-CM | POA: Diagnosis not present

## 2016-09-02 DIAGNOSIS — E039 Hypothyroidism, unspecified: Secondary | ICD-10-CM | POA: Diagnosis present

## 2016-09-02 DIAGNOSIS — I252 Old myocardial infarction: Secondary | ICD-10-CM

## 2016-09-02 DIAGNOSIS — I2510 Atherosclerotic heart disease of native coronary artery without angina pectoris: Secondary | ICD-10-CM | POA: Diagnosis present

## 2016-09-02 DIAGNOSIS — Z79890 Hormone replacement therapy (postmenopausal): Secondary | ICD-10-CM

## 2016-09-02 DIAGNOSIS — Z923 Personal history of irradiation: Secondary | ICD-10-CM

## 2016-09-02 DIAGNOSIS — Z938 Other artificial opening status: Secondary | ICD-10-CM

## 2016-09-02 LAB — COMPREHENSIVE METABOLIC PANEL
ALK PHOS: 162 U/L — AB (ref 38–126)
ALT: 86 U/L — AB (ref 17–63)
ANION GAP: 20 — AB (ref 5–15)
AST: 225 U/L — ABNORMAL HIGH (ref 15–41)
Albumin: 2.1 g/dL — ABNORMAL LOW (ref 3.5–5.0)
BILIRUBIN TOTAL: 0.7 mg/dL (ref 0.3–1.2)
BUN: 14 mg/dL (ref 6–20)
CALCIUM: 7.8 mg/dL — AB (ref 8.9–10.3)
CO2: 14 mmol/L — ABNORMAL LOW (ref 22–32)
CREATININE: 1.53 mg/dL — AB (ref 0.61–1.24)
Chloride: 100 mmol/L — ABNORMAL LOW (ref 101–111)
GFR calc non Af Amer: 43 mL/min — ABNORMAL LOW (ref >60)
GFR, EST AFRICAN AMERICAN: 50 mL/min — AB (ref >60)
Glucose, Bld: 289 mg/dL — ABNORMAL HIGH (ref 65–99)
Potassium: 5.4 mmol/L — ABNORMAL HIGH (ref 3.5–5.1)
Sodium: 134 mmol/L — ABNORMAL LOW (ref 135–145)
TOTAL PROTEIN: 5.3 g/dL — AB (ref 6.5–8.1)

## 2016-09-02 LAB — POCT I-STAT, CHEM 8
BUN: 16 mg/dL (ref 6–20)
CHLORIDE: 105 mmol/L (ref 101–111)
Calcium, Ion: 0.9 mmol/L — ABNORMAL LOW (ref 1.15–1.40)
Creatinine, Ser: 1.1 mg/dL (ref 0.61–1.24)
Glucose, Bld: 365 mg/dL — ABNORMAL HIGH (ref 65–99)
HCT: 24 % — ABNORMAL LOW (ref 39.0–52.0)
Hemoglobin: 8.2 g/dL — ABNORMAL LOW (ref 13.0–17.0)
POTASSIUM: 4.2 mmol/L (ref 3.5–5.1)
SODIUM: 134 mmol/L — AB (ref 135–145)
TCO2: 15 mmol/L — ABNORMAL LOW (ref 22–32)

## 2016-09-02 LAB — I-STAT TROPONIN, ED: TROPONIN I, POC: 1.6 ng/mL — AB (ref 0.00–0.08)

## 2016-09-02 LAB — CBC WITH DIFFERENTIAL/PLATELET
BASOS ABS: 0.1 10*3/uL (ref 0.0–0.1)
Basophils Relative: 1 %
EOS ABS: 0.2 10*3/uL (ref 0.0–0.7)
Eosinophils Relative: 2 %
HCT: 25.6 % — ABNORMAL LOW (ref 39.0–52.0)
HEMOGLOBIN: 8 g/dL — AB (ref 13.0–17.0)
LYMPHS PCT: 26 %
Lymphs Abs: 2 10*3/uL (ref 0.7–4.0)
MCH: 37.2 pg — AB (ref 26.0–34.0)
MCHC: 31.3 g/dL (ref 30.0–36.0)
MCV: 119.1 fL — ABNORMAL HIGH (ref 78.0–100.0)
MONO ABS: 0.4 10*3/uL (ref 0.1–1.0)
Monocytes Relative: 5 %
NEUTROS ABS: 5 10*3/uL (ref 1.7–7.7)
NEUTROS PCT: 66 %
PLATELETS: 269 10*3/uL (ref 150–400)
RBC: 2.15 MIL/uL — ABNORMAL LOW (ref 4.22–5.81)
RDW: 13.5 % (ref 11.5–15.5)
WBC: 7.7 10*3/uL (ref 4.0–10.5)

## 2016-09-02 LAB — POCT I-STAT 3, ART BLOOD GAS (G3+)
Acid-base deficit: 13 mmol/L — ABNORMAL HIGH (ref 0.0–2.0)
Bicarbonate: 14 mmol/L — ABNORMAL LOW (ref 20.0–28.0)
O2 Saturation: 100 %
PCO2 ART: 25.7 mmHg — AB (ref 32.0–48.0)
PH ART: 7.303 — AB (ref 7.350–7.450)
Patient temperature: 29.7
TCO2: 15 mmol/L — AB (ref 22–32)
pO2, Arterial: 188 mmHg — ABNORMAL HIGH (ref 83.0–108.0)

## 2016-09-02 LAB — I-STAT ARTERIAL BLOOD GAS, ED
ACID-BASE DEFICIT: 16 mmol/L — AB (ref 0.0–2.0)
BICARBONATE: 14.4 mmol/L — AB (ref 20.0–28.0)
O2 Saturation: 100 %
PH ART: 7.004 — AB (ref 7.350–7.450)
TCO2: 16 mmol/L — ABNORMAL LOW (ref 22–32)
pCO2 arterial: 57.8 mmHg — ABNORMAL HIGH (ref 32.0–48.0)
pO2, Arterial: 425 mmHg — ABNORMAL HIGH (ref 83.0–108.0)

## 2016-09-02 LAB — URINALYSIS, ROUTINE W REFLEX MICROSCOPIC
Bilirubin Urine: NEGATIVE
GLUCOSE, UA: 50 mg/dL — AB
KETONES UR: NEGATIVE mg/dL
Leukocytes, UA: NEGATIVE
Nitrite: NEGATIVE
PROTEIN: 100 mg/dL — AB
Specific Gravity, Urine: 1.01 (ref 1.005–1.030)
pH: 7 (ref 5.0–8.0)

## 2016-09-02 LAB — CBC
HCT: 28.7 % — ABNORMAL LOW (ref 39.0–52.0)
Hemoglobin: 9.4 g/dL — ABNORMAL LOW (ref 13.0–17.0)
MCH: 38.1 pg — ABNORMAL HIGH (ref 26.0–34.0)
MCHC: 32.8 g/dL (ref 30.0–36.0)
MCV: 116.2 fL — AB (ref 78.0–100.0)
PLATELETS: 250 10*3/uL (ref 150–400)
RBC: 2.47 MIL/uL — ABNORMAL LOW (ref 4.22–5.81)
RDW: 13.7 % (ref 11.5–15.5)
WBC: 7.6 10*3/uL (ref 4.0–10.5)

## 2016-09-02 LAB — I-STAT CHEM 8, ED
BUN: 17 mg/dL (ref 6–20)
CALCIUM ION: 0.97 mmol/L — AB (ref 1.15–1.40)
CREATININE: 1.3 mg/dL — AB (ref 0.61–1.24)
Chloride: 102 mmol/L (ref 101–111)
Glucose, Bld: 277 mg/dL — ABNORMAL HIGH (ref 65–99)
HEMATOCRIT: 25 % — AB (ref 39.0–52.0)
HEMOGLOBIN: 8.5 g/dL — AB (ref 13.0–17.0)
Potassium: 5.3 mmol/L — ABNORMAL HIGH (ref 3.5–5.1)
Sodium: 133 mmol/L — ABNORMAL LOW (ref 135–145)
TCO2: 15 mmol/L — AB (ref 22–32)

## 2016-09-02 LAB — TROPONIN I: TROPONIN I: 33.17 ng/mL — AB (ref <0.03)

## 2016-09-02 LAB — PROCALCITONIN: Procalcitonin: 2.56 ng/mL

## 2016-09-02 LAB — TYPE AND SCREEN
ABO/RH(D): A NEG
ANTIBODY SCREEN: NEGATIVE

## 2016-09-02 LAB — I-STAT CG4 LACTIC ACID, ED: Lactic Acid, Venous: 14.08 mmol/L (ref 0.5–1.9)

## 2016-09-02 LAB — GLUCOSE, CAPILLARY: GLUCOSE-CAPILLARY: 349 mg/dL — AB (ref 65–99)

## 2016-09-02 LAB — PROTIME-INR
INR: 1.67
Prothrombin Time: 19.9 seconds — ABNORMAL HIGH (ref 11.4–15.2)

## 2016-09-02 LAB — APTT: APTT: 53 s — AB (ref 24–36)

## 2016-09-02 LAB — CORTISOL: Cortisol, Plasma: 15.6 ug/dL

## 2016-09-02 LAB — BRAIN NATRIURETIC PEPTIDE: B NATRIURETIC PEPTIDE 5: 467.8 pg/mL — AB (ref 0.0–100.0)

## 2016-09-02 MED ORDER — EPINEPHRINE PF 1 MG/ML IJ SOLN
0.5000 ug/min | INTRAVENOUS | Status: DC
  Administered 2016-09-02: 0.5 ug/min via INTRAVENOUS
  Administered 2016-09-03 (×2): 20 ug/min via INTRAVENOUS
  Filled 2016-09-02 (×3): qty 4

## 2016-09-02 MED ORDER — FENTANYL CITRATE (PF) 100 MCG/2ML IJ SOLN: 100.0000 ug | Freq: Once | INTRAMUSCULAR | Status: DC | PRN

## 2016-09-02 MED ORDER — EPINEPHRINE PF 1 MG/10ML IJ SOSY
PREFILLED_SYRINGE | INTRAMUSCULAR | Status: AC | PRN
  Administered 2016-09-02 (×3): 1 via INTRAVENOUS

## 2016-09-02 MED ORDER — SODIUM CHLORIDE 0.9 % IV SOLN: 250.0000 mL | INTRAVENOUS | Status: DC | PRN

## 2016-09-02 MED ORDER — MIDAZOLAM HCL 50 MG/10ML IJ SOLN
2.0000 mg/h | INTRAMUSCULAR | Status: DC
  Administered 2016-09-03: 2 mg/h via INTRAVENOUS
  Filled 2016-09-02: qty 10

## 2016-09-02 MED ORDER — MIDAZOLAM BOLUS VIA INFUSION
2.0000 mg | INTRAVENOUS | Status: DC | PRN
  Administered 2016-09-03: 2 mg via INTRAVENOUS
  Filled 2016-09-02: qty 2

## 2016-09-02 MED ORDER — SODIUM CHLORIDE 0.9 % IV SOLN
1.0000 ug/kg/min | INTRAVENOUS | Status: DC
  Administered 2016-09-03: 1 ug/kg/min via INTRAVENOUS
  Administered 2016-09-03: 7.7 ug/kg/min via INTRAVENOUS
  Filled 2016-09-02: qty 20

## 2016-09-02 MED ORDER — FAMOTIDINE IN NACL 20-0.9 MG/50ML-% IV SOLN: 20.0000 mg | Freq: Two times a day (BID) | INTRAVENOUS | Status: DC

## 2016-09-02 MED ORDER — MIDAZOLAM HCL 2 MG/2ML IJ SOLN: 2.0000 mg | Freq: Once | INTRAMUSCULAR | Status: DC | PRN

## 2016-09-02 MED ORDER — FENTANYL BOLUS VIA INFUSION
50.0000 ug | INTRAVENOUS | Status: DC | PRN
  Administered 2016-09-03: 50 ug via INTRAVENOUS
  Filled 2016-09-02: qty 50

## 2016-09-02 MED ORDER — FENTANYL 2500MCG IN NS 250ML (10MCG/ML) PREMIX INFUSION: 100.0000 ug/h | INTRAVENOUS | Status: DC

## 2016-09-02 MED ORDER — FENTANYL BOLUS VIA INFUSION
50.0000 ug | INTRAVENOUS | Status: DC | PRN
  Filled 2016-09-02: qty 50

## 2016-09-02 MED ORDER — ARTIFICIAL TEARS OPHTHALMIC OINT: 1.0000 "application " | TOPICAL_OINTMENT | Freq: Three times a day (TID) | OPHTHALMIC | Status: DC

## 2016-09-02 MED ORDER — MIDAZOLAM HCL 2 MG/2ML IJ SOLN: 2.0000 mg | Freq: Once | INTRAMUSCULAR | Status: DC

## 2016-09-02 MED ORDER — PIPERACILLIN-TAZOBACTAM 3.375 G IVPB 30 MIN
3.3750 g | Freq: Once | INTRAVENOUS | Status: AC
  Administered 2016-09-02: 3.375 g via INTRAVENOUS
  Filled 2016-09-02: qty 50

## 2016-09-02 MED ORDER — NOREPINEPHRINE BITARTRATE 1 MG/ML IV SOLN
0.0000 ug/min | INTRAVENOUS | Status: DC
  Administered 2016-09-02: 40 ug/min via INTRAVENOUS
  Administered 2016-09-03: 50 ug/min via INTRAVENOUS
  Filled 2016-09-02 (×2): qty 16

## 2016-09-02 MED ORDER — PROPOFOL 1000 MG/100ML IV EMUL
25.0000 ug/kg/min | INTRAVENOUS | Status: DC
Start: 2016-09-02 — End: 2016-09-02

## 2016-09-02 MED ORDER — NOREPINEPHRINE BITARTRATE 1 MG/ML IV SOLN
0.0000 ug/min | INTRAVENOUS | Status: DC
  Administered 2016-09-02: 10 ug/min via INTRAVENOUS
  Filled 2016-09-02 (×2): qty 4

## 2016-09-02 MED ORDER — PROPOFOL 1000 MG/100ML IV EMUL: 25.0000 ug/kg/min | INTRAVENOUS | Status: DC

## 2016-09-02 MED ORDER — SODIUM CHLORIDE 0.9 % IV BOLUS (SEPSIS)
500.0000 mL | Freq: Once | INTRAVENOUS | Status: AC
  Administered 2016-09-02: 500 mL via INTRAVENOUS

## 2016-09-02 MED ORDER — FENTANYL CITRATE (PF) 100 MCG/2ML IJ SOLN: 100.0000 ug | Freq: Once | INTRAMUSCULAR | Status: DC

## 2016-09-02 MED ORDER — FENTANYL 2500MCG IN NS 250ML (10MCG/ML) PREMIX INFUSION
100.0000 ug/h | INTRAVENOUS | Status: DC
  Administered 2016-09-03: 100 ug/h via INTRAVENOUS
  Filled 2016-09-02: qty 250

## 2016-09-02 MED ORDER — ARTIFICIAL TEARS OPHTHALMIC OINT
1.0000 "application " | TOPICAL_OINTMENT | Freq: Three times a day (TID) | OPHTHALMIC | Status: DC
  Administered 2016-09-03 (×2): 1 via OPHTHALMIC

## 2016-09-02 MED ORDER — FENTANYL CITRATE (PF) 100 MCG/2ML IJ SOLN: 50.0000 ug | Freq: Once | INTRAMUSCULAR | Status: DC

## 2016-09-02 MED ORDER — EPINEPHRINE PF 1 MG/10ML IJ SOSY
PREFILLED_SYRINGE | INTRAMUSCULAR | Status: AC | PRN
  Administered 2016-09-02: 0.5 mg via INTRAVENOUS

## 2016-09-02 MED ORDER — PHENYLEPHRINE HCL 10 MG/ML IJ SOLN
0.0000 ug/min | INTRAMUSCULAR | Status: DC
  Administered 2016-09-03 (×2): 400 ug/min via INTRAVENOUS
  Administered 2016-09-03: 20 ug/min via INTRAVENOUS
  Administered 2016-09-03: 400 ug/min via INTRAVENOUS
  Filled 2016-09-02 (×6): qty 4

## 2016-09-02 MED ORDER — ENOXAPARIN SODIUM 40 MG/0.4ML ~~LOC~~ SOLN: 40.0000 mg | SUBCUTANEOUS | Status: DC

## 2016-09-02 MED ORDER — EPINEPHRINE PF 1 MG/10ML IJ SOSY
PREFILLED_SYRINGE | INTRAMUSCULAR | Status: AC | PRN
  Administered 2016-09-02: 1 via INTRAVENOUS

## 2016-09-02 MED ORDER — CISATRACURIUM BOLUS VIA INFUSION
0.0500 mg/kg | Freq: Once | INTRAVENOUS | Status: DC
  Filled 2016-09-02: qty 8

## 2016-09-02 MED ORDER — VANCOMYCIN HCL IN DEXTROSE 1-5 GM/200ML-% IV SOLN
1000.0000 mg | Freq: Once | INTRAVENOUS | Status: AC
  Administered 2016-09-02: 1000 mg via INTRAVENOUS
  Filled 2016-09-02: qty 200

## 2016-09-02 MED ORDER — KCL IN DEXTROSE-NACL 20-5-0.45 MEQ/L-%-% IV SOLN
INTRAVENOUS | Status: DC
  Filled 2016-09-02: qty 1000

## 2016-09-02 NOTE — CV Procedure (Signed)
Left Subclavian Vein Triple Lumen Catheter Placement  Indication: Refractory hypotension. Need to assess volume status  Procedure explained to family and consent signed  Procedure: The left infra- and supraclavicular area was prepped and draped in a sterile fashion. Local anesthesia was administered with 1% lidocaine in the subcutaneous tissues. The left subclavian vein was accessed using the Seldinger technique. After entering the vessel, the guidewire was threaded into the vein and the needle removed. After enlarging the insertion site with a nick from a surgical blade, the vessel dilator was threaded over the guide wire, then removed. The catheter was then threaded over the guidewire. Following removal of the guidewire the catheter was sutured into place. The patient tolerated the procedure well. A post-procedure CXR will be obtained.

## 2016-09-02 NOTE — Progress Notes (Signed)
Pt transport to [facility] without event.  Rt given report by Ferdie Ping, RRT to Arvella Nigh, RRt.

## 2016-09-02 NOTE — Progress Notes (Signed)
Chaplain provided emotional support to family, escorted family members to consult room and bedside, and facilitated communication with medical staff. Offered ongoing support. Sister, son, wife, and a variety of other family members were present. Son and grandsons are especially anxious and upset. Some family members seem to be aware that patient is unlikely to survive, but they appear to be protecting the son and his two sons.  A fiance states that she has been trying to prepare her partner (a grandson) for this day, as patient has been extremely fragile.  Please contact as requested or needed.   Luana Shu [phone]   08/12/2016 2000  Clinical Encounter Type  Visited With Family  Visit Type Initial;Critical Care;ED  Referral From Nurse  Consult/Referral To Chaplain  Stress Factors  Patient Stress Factors Health changes  Family Stress Factors Loss of control;Major life changes;Lack of knowledge

## 2016-09-02 NOTE — ED Triage Notes (Deleted)
Per [facility], Pt was a witnessed arrest by family where he fell prone to the floor. Pt was down for approximately 4 minutes before Fire started CPR. When EMS arrved, ,pt was found in PEA. During transport, 2 of EPI given, no shocking, pt had return of ROSC. When arrived to the ED, pt had pulses, unresponsive, airway intact with Size 4 King Airway. IO placed in Left Proximal TIbia.

## 2016-09-02 NOTE — Progress Notes (Signed)
Post intubation ABG obtained on patient.  ABG obtained on settings of tidal volume of 470 (6cc due to pneumo), RR of 26, FIO2 of 100%, PEEP of 5.  Increased tidal volume to 620 (8cc).  Will continue to monitor.    Ref. Range 09/04/2016 17:23  Sample type Unknown ARTERIAL  pH, Arterial Latest Ref Range: 7.350 - 7.450  7.004 (LL)  pCO2 arterial Latest Ref Range: 32.0 - 48.0 mmHg 57.8 (H)  pO2, Arterial Latest Ref Range: 83.0 - 108.0 mmHg 425.0 (H)  TCO2 Latest Ref Range: 22 - 32 mmol/L 16 (L)  Acid-base deficit Latest Ref Range: 0.0 - 2.0 mmol/L 16.0 (H)  Bicarbonate Latest Ref Range: 20.0 - 28.0 mmol/L 14.4 (L)  O2 Saturation Latest Units: % 100.0  Patient temperature Unknown 98.6 F  Collection site Unknown RADIAL, ALLEN'S T.Marland KitchenMarland Kitchen

## 2016-09-02 NOTE — ED Notes (Signed)
MD starting central line,  Nurse will draw addl blood.

## 2016-09-02 NOTE — H&P (Signed)
PULMONARY / CRITICAL CARE MEDICINE   Name: [name] MRN: [id number] DOB: [birth year]    ADMISSION DATE:  08/27/2016 CONSULTATION DATE:  09/01/2016  REFERRING MD:  Roxine Caddy  CHIEF COMPLAINT:  S/p CP arrest  HISTORY OF PRESENT ILLNESS:   [name] is a 71 y.o W M brought to the ED by EMS after sustaining a CP arrest. The patient was last observed A&O approximately 30 min before being found by the family slumped over in a chair on the porch. The family provided CPR until EMS arrival. The initial rhythm was a bradycardic PEA. EMS performed ~30 min CPR with 6 rounds of epi; CPR was being performed upon arrival to the ED. While in the ED he had one episode of V-fib for which he received DC countershock. The initial CXR post intubation demonstrated a left pneumothorax, for which a chest tube was placed.  The family reports that the patient is s/p laryngeal Ca ~ 8 years ago and was diagnosed with left lung Ca ~ July of this year and is currently undergoing XRT. Per the daughter, he also had a previous dx of colon Ca. He is a former smoker who quit at the time of his laryngeal Ca dx. Although the daughter is unaware as to whether he had previously been diagnosed with COPD, his home meds includes Symbicort and ipratropium, which he has only been using prn.  PAST MEDICAL HISTORY :  He  has a past medical history of Laryngeal cancer ([place]). He is s/p coronary artery stent placement for previous MI.  PAST SURGICAL HISTORY: He  has no past surgical history on file.  Allergies  Allergen Reactions  . Aspirin   . Ibuprofen   . Naproxen Sodium     No current facility-administered medications on file prior to encounter.    No current outpatient prescriptions on file prior to encounter.    FAMILY HISTORY:  His has no family status information on file.    SOCIAL HISTORY: He    Smoking hx as per described in the HPI.  REVIEW OF SYSTEMS:   General: No prior systemic c/o of fevers, rigors,  sweats HEENT: s/p cataract surgery; no reports of decreased auditory acuity; s/p laryngeal Ca with trach in situ Cor: s/p stent placement; no anginal symptoms per family Resp: On meds for what appears to be COPD; uses inhalers for dyspneic aepisodes GI: No reports of hematechezia, melena, N/V/D/C GU: No reports of hematuria/dysuria Endo: Is on levothroxine home med; family does not report hx DM Neuro: No hx seizures/syncope  SUBJECTIVE:  Intubated, sedated and hypotensive  VITAL SIGNS: BP (!) 89/62   Pulse 80   Temp (!) 90 F (32.2 C)   Resp (!) 26   Ht 6' (1.829 m)   SpO2 99%   HEMODYNAMICS:    VENTILATOR SETTINGS: Vent Mode: PRVC FiO2 (%):  [40 %-100 %] 40 % Set Rate:  [26 bmp] 26 bmp Vt Set:  [470 mL-620 mL] 620 mL PEEP:  [5 cmH20] 5 cmH20 Plateau Pressure:  [22 cmH20-32 cmH20] 32 cmH20  INTAKE / OUTPUT: I/O last 3 completed shifts: In: 1000 [I.V.:1000] Out: -   PHYSICAL EXAMINATION: General:  WD/WN W M inubated and hypotensive on max Levophed Neuro:  Pupils minimally reactive; no response to verbal or noxious stimuli HEENT:  Fundi benign; cerumen AS, clear TM AD; OG in place; trach in situ Cardiovascular:  RRR, no murmurs Lungs:  Bilateral expiratory rhonchi and wheezes Abdomen:  Protuberant; Dec. BS with  no guarding; no obvious organomegaly Musculoskeletal:  No inflamed joints Skin:  No C,C, E  LABS:  BMET  Recent Labs Lab 08/30/2016 1628 08/27/2016 1645  NA 134* 133*  K 5.4* 5.3*  CL 100* 102  CO2 14*  --   BUN 14 17  CREATININE 1.53* 1.30*  GLUCOSE 289* 277*    Electrolytes  Recent Labs Lab 08/17/2016 1628  CALCIUM 7.8*    CBC  Recent Labs Lab 08/18/2016 1628 08/13/2016 1645  WBC 7.7  --   HGB 8.0* 8.5*  HCT 25.6* 25.0*  PLT 269  --     Coag's No results for input(s): APTT, INR in the last 168 hours.  Sepsis Markers  Recent Labs Lab 09/05/2016 1645  LATICACIDVEN 14.08*    ABG  Recent Labs Lab 09/07/2016 1723  PHART 7.004*   PCO2ART 57.8*  PO2ART 425.0*    Liver Enzymes  Recent Labs Lab 08/10/2016 1628  AST 225*  ALT 86*  ALKPHOS 162*  BILITOT 0.7  ALBUMIN 2.1*    Cardiac Enzymes No results for input(s): TROPONINI, PROBNP in the last 168 hours.  Glucose No results for input(s): GLUCAP in the last 168 hours.  Imaging Dg Chest Port 1 View  Result Date: 08/19/2016 CLINICAL DATA:  Status post CPR EXAM: PORTABLE CHEST 1 VIEW COMPARISON:  Subsequent CXR performed at 1633 hours FINDINGS: The tip of an endotracheal tube is 5.8 cm above the carina. Moderate left-sided 30-40% pneumothorax is partially imaged with overlying subcutaneous emphysema. A subsequent chest radiograph demonstrates placement of a chest tube interval re-expansion of the pneumothorax. No apparent rib fracture on current image but is suspected given history of CPR. IMPRESSION: 1. Moderate left-sided pneumothorax with associated subcutaneous soft tissue emphysema. Subsequent chest radiograph demonstrates a chest tube in place with interval re-expansion of the left lung. 2. Endotracheal tube tip approximately 5.8 cm above the carina. Electronically Signed   By: Ashley Royalty M.D.   On: 08/27/2016 17:45   Dg Chest Port 1 View  Result Date: 08/20/2016 CLINICAL DATA:  Post CPR, chest tube placement EXAM: PORTABLE CHEST 1 VIEW COMPARISON:  09/07/2016 FINDINGS: Endotracheal or tracheostomy tube tip is in the mid trachea. Left chest tube is in place. Note definite pneumothorax seen. There is subcutaneous emphysema noted in the left chest wall. Diffuse airspace disease throughout the lungs bilaterally. Cardiomegaly. IMPRESSION: Endotracheal or tracheostomy tube tip is in the mid trachea. Interval placement of left chest tube. No definite visible left pneumothorax. Subcutaneous emphysema in the left chest wall. Diffuse bilateral airspace disease, cardiomegaly. Electronically Signed   By: Rolm Baptise M.D.   On: 08/22/2016 17:06     STUDIES:  CXR:  Increased interstitial markings bilaterally  CULTURES: None yet sent  ANTIBIOTICS: Vanc + Zosyn  SIGNIFICANT EVENTS: S/p C-P arrest; currently on TTM  LINES/TUBES: Left chest tube; trach; left subclavian triple lumen  DISCUSSION: This 71 y.o male  S/p arrest. Underlying cause is unclear. 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 Other possibility is sepsis from an unclear source.  ASSESSMENT / PLAN:  PULMONARY A: Acute hypercapneic respiratory failure post C-P arrest. P:   Mech ventilatory support  CARDIOVASCULAR A:  S/p arrest, possibly secondary to an arrhythmia. Remains hemodynamically unstable P:  Will repeat troponins. Will add epi drip. Will check ECHO and monitor CVP. Will also check cortisol  RENAL A:   No prior renal insufficiency P:   Will  monitor BUN/Cr as pt at risk for ARF.  GASTROINTESTINAL A:   No pre-arrest hx suggestive of recent GI disease P:   Monitor  HEMATOLOGIC A:   Macrocytic anemia; on Vit B12 at home P:  Will resume after resolution of this acute event  INFECTIOUS A:   Sepsis is also possible. On vanc+Zosyn P:   Will culture  ENDOCRINE A:   Apparent hx hypothyroidism    P:   Will check levels as well as cortisol  NEUROLOGIC A:   Altered MS post arrest P:   RASS goal: Will continue TTM for 24 hours.  RASS -3   FAMILY  - Updates: Spoke with family regarding TTM and will assess MS after rewarming  Additional critical care time 30 min    Pulmonary and [place] Pager: [phone]  09/08/2016, 7:57 PM

## 2016-09-02 NOTE — Procedures (Signed)
Tracheostomy Change Note  Patient Details:   Name: [name] DOB: 01-25-1945 MRN: [id number]    Airway Documentation:     Evaluation  O2 sats: stable throughout Complications: No apparent complications Patient did tolerate procedure well.    Patient arrived via EMS with ETT in stoma.  Placed #6 Shiley cuffed trach without complications.  Positive color change was noted.  Patient sats maintained during procedure.  Bilateral breath sounds auscultated.      Philomena Doheny 09/06/2016, 5:32 PM

## 2016-09-02 NOTE — ED Provider Notes (Signed)
[facility] DEPT Provider Note   CSN: [id number] Arrival date & time: 08/29/2016  1610     History   Chief Complaint Chief Complaint  Patient presents with  . Cardiac Arrest    HPI KASSEM KIBBE is a 71 y.o. male.  Patient is a 71 year old male with limited history on arrival however once family arrived it was noted that he has a history of lung cancer and yesterday had a type of radiation procedure to his left upper lung.  He was sitting on a porch this morning and a family member found him slumped over in the chair. They don't feel that he was down for an extended amount time but aren't completely sure how long he was down. When EMS arrived he was found to be in cardiac arrest. Family did provide CPR prior to EMS arrival. Initial rhythm was asystole. However EMS was able to obtain [facility] After about 30 minutes of downtime and 6 epinephrines. He did rearrest just prior to arrival to the ED and chest compressions were being performed on arrival to the ED. He has a tracheostomy that is been in for about 10 years per family. EMS had placed an ET tube down the stoma.      Past Medical History:  Diagnosis Date  . Laryngeal cancer [facility])     Patient Active Problem List   Diagnosis Date Noted  . Cardiac arrest ([place]) 08/20/2016  . Cardiopulmonary arrest with successful resuscitation ([place]) 08/13/2016    No past surgical history on file.     Home Medications    Prior to Admission medications   Medication Sig Start Date End Date Taking? Authorizing Provider  Acetaminophen (APAP PO) Take by mouth.   Yes [provider]  ALBUTEROL IN Inhale into the lungs.   Yes [provider]  AMITRIPTYLINE HCL PO Take by mouth.   Yes [provider]  aspirin EC 81 MG tablet Take 81 mg by mouth at bedtime.    Yes [provider]  Buprenorphine HCl (BELBUCA) 600 MCG FILM Place inside cheek.   Yes [provider]  clopidogrel (PLAVIX) 75 MG tablet Take  75 mg by mouth daily.   Yes [provider]  guaifenesin (HUMIBID E) 400 MG TABS tablet Take 400 mg by mouth 2 (two) times daily.   Yes [provider]  LEVOTHYROXINE SODIUM PO Take by mouth.   Yes [provider]  midodrine (PROAMATINE) 5 MG tablet Take 5 mg by mouth 3 (three) times daily with meals.   Yes [provider]  mirtazapine (REMERON) 15 MG tablet Take 15 mg by mouth at bedtime.   Yes [provider]  OXYCODONE HCL PO Take by mouth.   Yes [provider]  PRESCRIPTION MEDICATION Ipratropium   Yes [provider]  Soft Lens Products (SALINE) 0.9 % SOLN by Does not apply route.   Yes [provider]  vitamin B-12 (CYANOCOBALAMIN) 1000 MCG tablet Take 2,000 mcg by mouth daily.   Yes [provider]  docusate sodium (COLACE) 50 MG capsule Take 50 mg by mouth daily as needed for mild constipation.    [provider]  esomeprazole (NEXIUM) 40 MG capsule Take 40 mg by mouth daily at 12 noon.    [provider]  fludrocortisone (FLORINEF) 0.1 MG tablet Take 0.2 mg by mouth daily.    [provider]  HYDROmorphone (DILAUDID) 4 MG tablet Take 4 mg by mouth every 4 (four) hours as needed for  moderate pain or severe pain.    [provider]  megestrol (MEGACE) 40 MG tablet Take 40 mg by mouth 2 (two) times daily.    [provider]  potassium chloride SA (K-DUR,KLOR-CON) 20 MEQ tablet Take 40 mEq by mouth 2 (two) times daily.    [provider]  pravastatin (PRAVACHOL) 10 MG tablet Take 10 mg by mouth daily.    [provider]    Family History No family history on file.  Social History Social History  Substance Use Topics  . Smoking status: Not on file  . Smokeless tobacco: Not on file  . Alcohol use Not on file     Allergies   Aspirin; Ibuprofen; and Naproxen sodium   Review of Systems Review of Systems  Unable to perform ROS: Patient  unresponsive     Physical Exam Updated Vital Signs BP (!) 86/59   Pulse 71   Temp (!) 86.2 F (30.1 C)   Resp (!) 26   Ht 6' (1.829 m)   Wt 77 kg (169 lb 12.1 oz)   SpO2 98%   BMI 23.02 kg/m   Physical Exam  Constitutional: He appears well-developed and well-nourished.  HENT:  Head: Normocephalic and atraumatic.  Eyes:  Pupils are nonreactive  Neck: Normal range of motion. Neck supple.  Cardiovascular:  Patient has no spontaneous pulse, CPR is being performed by manual compressions  Pulmonary/Chest: He has rales.  Patient's having respirations assisted through an ET tube in the patient's stoma. Bilateral breath sounds are placed  Abdominal: He exhibits no distension.  Musculoskeletal: He exhibits no edema.  Neurological:  Unresponsive  Skin: Skin is dry.  Skin is cool and dry     ED Treatments / Results  Labs (all labs ordered are listed, but only abnormal results are displayed) Labs Reviewed  COMPREHENSIVE METABOLIC PANEL - Abnormal; Notable for the following:       Result Value   Sodium 134 (*)    Potassium 5.4 (*)    Chloride 100 (*)    CO2 14 (*)    Glucose, Bld 289 (*)    Creatinine, Ser 1.53 (*)    Calcium 7.8 (*)    Total Protein 5.3 (*)    Albumin 2.1 (*)    AST 225 (*)    ALT 86 (*)    Alkaline Phosphatase 162 (*)    GFR calc non Af Amer 43 (*)    GFR calc Af Amer 50 (*)    Anion gap 20 (*)    All other components within normal limits  CBC WITH DIFFERENTIAL/PLATELET - Abnormal; Notable for the following:    RBC 2.15 (*)    Hemoglobin 8.0 (*)    HCT 25.6 (*)    MCV 119.1 (*)    MCH 37.2 (*)    All other components within normal limits  URINALYSIS, ROUTINE W REFLEX MICROSCOPIC - Abnormal; Notable for the following:    APPearance CLOUDY (*)    Glucose, UA 50 (*)    Hgb urine dipstick MODERATE (*)    Protein, ur 100 (*)    Bacteria, UA RARE (*)    Squamous Epithelial / LPF 0-5 (*)    All other components within normal limits  CBC -  Abnormal; Notable for the following:    RBC 2.47 (*)    Hemoglobin 9.4 (*)    HCT 28.7 (*)    MCV 116.2 (*)    MCH 38.1 (*)    All other  components within normal limits  TROPONIN I - Abnormal; Notable for the following:    Troponin I 33.17 (*)    All other components within normal limits  BRAIN NATRIURETIC PEPTIDE - Abnormal; Notable for the following:    B Natriuretic Peptide 467.8 (*)    All other components within normal limits  PROTIME-INR - Abnormal; Notable for the following:    Prothrombin Time 19.9 (*)    All other components within normal limits  APTT - Abnormal; Notable for the following:    aPTT 53 (*)    All other components within normal limits  I-STAT TROPONIN, ED - Abnormal; Notable for the following:    Troponin i, poc 1.60 (*)    All other components within normal limits  I-STAT CHEM 8, ED - Abnormal; Notable for the following:    Sodium 133 (*)    Potassium 5.3 (*)    Creatinine, Ser 1.30 (*)    Glucose, Bld 277 (*)    Calcium, Ion 0.97 (*)    TCO2 15 (*)    Hemoglobin 8.5 (*)    HCT 25.0 (*)    All other components within normal limits  I-STAT CG4 LACTIC ACID, ED - Abnormal; Notable for the following:    Lactic Acid, Venous 14.08 (*)    All other components within normal limits  I-STAT ARTERIAL BLOOD GAS, ED - Abnormal; Notable for the following:    pH, Arterial 7.004 (*)    pCO2 arterial 57.8 (*)    pO2, Arterial 425.0 (*)    Bicarbonate 14.4 (*)    TCO2 16 (*)    Acid-base deficit 16.0 (*)    All other components within normal limits  POCT I-STAT 3, ART BLOOD GAS (G3+) - Abnormal; Notable for the following:    pH, Arterial 7.303 (*)    pCO2 arterial 25.7 (*)    pO2, Arterial 188.0 (*)    Bicarbonate 14.0 (*)    TCO2 15 (*)    Acid-base deficit 13.0 (*)    All other components within normal limits  POCT I-STAT, CHEM 8 - Abnormal; Notable for the following:    Sodium 134 (*)    Glucose, Bld 365 (*)    Calcium, Ion 0.90 (*)    TCO2 15 (*)     Hemoglobin 8.2 (*)    HCT 24.0 (*)    All other components within normal limits  URINE CULTURE  CULTURE, BLOOD (ROUTINE X 2)  CULTURE, BLOOD (ROUTINE X 2)  MRSA PCR SCREENING  PROCALCITONIN  CORTISOL  TROPONIN I  TROPONIN I  STREP PNEUMONIAE URINARY ANTIGEN  LEGIONELLA PNEUMOPHILA SEROGP 1 UR AG  BLOOD GAS, ARTERIAL  CBC  BASIC METABOLIC PANEL  MAGNESIUM  BLOOD GAS, ARTERIAL  PHOSPHORUS  GLUCOSE, CAPILLARY  TYPE AND SCREEN    EKG  EKG Interpretation  Date/Time:  Saturday September 02 2016 16:18:59 EDT Ventricular Rate:  105 PR Interval:    QRS Duration: 150 QT Interval:  387 QTC Calculation: 512 R Axis:   82 Text Interpretation:  Sinus or ectopic atrial tachycardia Nonspecific intraventricular conduction delay Inferior infarct, age indeterminate No old tracing to compare Confirmed by Malvin Johns [phone]) on 09/08/2016 4:23:38 PM       Radiology Dg Chest Port 1 View  Result Date: 08/10/2016 CLINICAL DATA:  Chest tube and central line placement. EXAM: PORTABLE CHEST 1 VIEW COMPARISON:  Today at 4:33 p.m. FINDINGS: Tracheostomy tube in place. There is a new orogastric tube which at least reaches  the diaphragm. New left-sided central line with tip at the SVC level. Small left pneumothorax. Extensive left chest wall emphysema. Left chest tube appear shorter than before, but the side port still overlaps the thoracic cavity. More horizontal orientation likely accentuates this appearance. Unchanged cardiomegaly. Unchanged patchy bilateral parenchymal opacity that is likely superimposed on advanced COPD. IMPRESSION: 1. New central line with tip at the SVC level. 2. New orogastric tube which at least reaches the diaphragm. 3. Left-sided chest tube is slightly shorter than before, but still overlaps the left chest. Left pneumothorax is small. 4. Cardiomegaly, extensive bilateral aspiration or pneumonia, and probable emphysema. There is volume loss on the right, central mass not excluded.  Electronically Signed   By: Monte Fantasia M.D.   On: 08/22/2016 20:18   Dg Chest Port 1 View  Result Date: 08/16/2016 CLINICAL DATA:  Status post CPR EXAM: PORTABLE CHEST 1 VIEW COMPARISON:  Subsequent CXR performed at 1633 hours FINDINGS: The tip of an endotracheal tube is 5.8 cm above the carina. Moderate left-sided 30-40% pneumothorax is partially imaged with overlying subcutaneous emphysema. A subsequent chest radiograph demonstrates placement of a chest tube interval re-expansion of the pneumothorax. No apparent rib fracture on current image but is suspected given history of CPR. IMPRESSION: 1. Moderate left-sided pneumothorax with associated subcutaneous soft tissue emphysema. Subsequent chest radiograph demonstrates a chest tube in place with interval re-expansion of the left lung. 2. Endotracheal tube tip approximately 5.8 cm above the carina. Electronically Signed   By: Ashley Royalty M.D.   On: 08/23/2016 17:45   Dg Chest Port 1 View  Result Date: 08/14/2016 CLINICAL DATA:  Post CPR, chest tube placement EXAM: PORTABLE CHEST 1 VIEW COMPARISON:  08/31/2016 FINDINGS: Endotracheal or tracheostomy tube tip is in the mid trachea. Left chest tube is in place. Note definite pneumothorax seen. There is subcutaneous emphysema noted in the left chest wall. Diffuse airspace disease throughout the lungs bilaterally. Cardiomegaly. IMPRESSION: Endotracheal or tracheostomy tube tip is in the mid trachea. Interval placement of left chest tube. No definite visible left pneumothorax. Subcutaneous emphysema in the left chest wall. Diffuse bilateral airspace disease, cardiomegaly. Electronically Signed   By: Rolm Baptise M.D.   On: 08/14/2016 17:06    Procedures CHEST TUBE INSERTION Date/Time: 08/11/2016 6:09 PM Performed by: Sari Cogan Authorized by: Malvin Johns   Consent:    Consent obtained:  Emergent situation Pre-procedure details:    Skin preparation:  ChloraPrep Anesthesia (see MAR for exact  dosages):    Anesthesia method:  None Procedure details:    Placement location:  L lateral   Scalpel size:  11   Tube size (Fr):  24   Dissection instrument:  Finger and Kelly clamp   Ultrasound guidance: no     Tension pneumothorax: yes     Tube connected to:  Suction   Drainage characteristics:  Bloody   Suture material:  2-0 silk Post-procedure details:    Post-insertion x-ray findings: tube in good position     Patient tolerance of procedure:  Tolerated well, no immediate complications   (including critical care time)  Medications Ordered in ED Medications  EPINEPHrine (ADRENALIN) 4 mg in dextrose 5 % 250 mL (0.016 mg/mL) infusion (10.5 mcg/min Intravenous Rate/Dose Change 08/12/2016 2130)  norepinephrine (LEVOPHED) 16 mg in dextrose 5 % 250 mL (0.064 mg/mL) infusion (40 mcg/min Intravenous New Bag/Given 08/28/2016 2105)  0.9 %  sodium chloride infusion (not administered)  artificial tears (LACRILUBE) ophthalmic ointment 1 application (not administered)  enoxaparin (LOVENOX) injection 40 mg (not administered)  famotidine (PEPCID) IVPB 20 mg premix (not administered)  dextrose 5 % and 0.45 % NaCl with KCl 20 mEq/L infusion (not administered)  sodium chloride 0.9 % bolus 500 mL (not administered)  fentaNYL (SUBLIMAZE) injection 50 mcg (not administered)  propofol (DIPRIVAN) 1000 MG/100ML infusion (not administered)  piperacillin-tazobactam (ZOSYN) IVPB 3.375 g (0 g Intravenous Stopped 08/21/2016 2103)  vancomycin (VANCOCIN) IVPB 1000 mg/200 mL premix (0 mg Intravenous Stopped 09/04/2016 2220)  EPINEPHrine (ADRENALIN) 1 MG/10ML injection (1 Syringe Intravenous Given 09/08/2016 1637)  EPINEPHrine (ADRENALIN) 1 MG/10ML injection (1 Syringe Intravenous Given 08/11/2016 1637)  EPINEPHrine (ADRENALIN) 1 MG/10ML injection (0.5 mg Intravenous Given 08/18/2016 1658)     Initial Impression / Assessment and Plan / ED Course  I have reviewed the triage vital signs and the nursing notes.  Pertinent labs &  imaging results that were available during my care of the patient were reviewed by me and considered in my medical decision making (see chart for details).     Patient is a 71 year old male with a history of lung cancer, unknown other history who presents in cardiac arrest. He arrived in cardiac arrest with an abdominal PE a type rhythm. ACLS algorithms were performed. The ETT was changed out for a Shiley trach with good end tidal CO2 waveform.   We did obtain ROCS after about 10 minutes of CPR.  However he was noted again to go back into cardiac arrest after about 20 minutes. He had one run of v-fib and was defibrillated.  At this time he was noted to have subcutaneous air in the soft tissues of his left upper chest. Chest x-ray shows a large pneumothorax. I was able to place a chest tube with a large rush of air and a small amount of blood as well. Following this, he did obtain ROSC.  I consulted with critical care who will see the patient. He initially had a good blood pressure but then became hypotensive. He was given IV fluids as well as started on Levophed for blood pressure support.  Dr. Oletta Darter with CCM recommends ice packs to groin/axilla.  Family updated on pt condition.  CRITICAL CARE Performed by: Ryan Palermo Total critical care time: 90 minutes Critical care time was exclusive of separately billable procedures and treating other patients. Critical care was necessary to treat or prevent imminent or life-threatening deterioration. Critical care was time spent personally by me on the following activities: development of treatment plan with patient and/or surrogate as well as nursing, discussions with consultants, evaluation of patient's response to treatment, examination of patient, obtaining history from patient or surrogate, ordering and performing treatments and interventions, ordering and review of laboratory studies, ordering and review of radiographic studies, pulse oximetry and  re-evaluation of patient's condition.   Final Clinical Impressions(s) / ED Diagnoses   Final diagnoses:  Cardiac arrest [facility] [place])  Primary spontaneous pneumothorax  Status post chest tube placement [facility])    New Prescriptions Current Discharge Medication List       Malvin Johns, MD 08/25/2016 2337

## 2016-09-02 NOTE — Code Documentation (Signed)
Pt arrived via [employer]. Family witnessed the pt cardiac arrest on the porch at home. Pt had been down close to an hour before arriving to ED. EMS did 30 minutes of CPR and gave 6 rounds of epi in the field. EMS states pt was in PEA/junctional rhythm. Pt bagged and compressions being done on arrival to ED.

## 2016-09-02 NOTE — Procedures (Signed)
Arterial Catheter Insertion Procedure Note CHANDLER SWIDERSKI [id number] 04-29-1945  Procedure: Insertion of Arterial Catheter  Indications: Blood pressure monitoring and Frequent blood sampling  Procedure Details Consent: Unable to obtain consent because of emergent medical necessity. Time Out: Verified patient identification, verified procedure, site/side was marked, verified correct patient position, special equipment/implants available, medications/allergies/relevent history reviewed, required imaging and test results available.  Performed  Maximum sterile technique was used including antiseptics, cap, gloves, gown, hand hygiene and mask. Skin prep: Chlorhexidine; local anesthetic administered 20 gauge catheter was inserted into right radial artery using the Seldinger technique.  Evaluation Blood flow good; BP tracing good. Complications: No apparent complications.   Clare Gandy, Chales Abrahams 08/29/2016

## 2016-09-02 NOTE — ED Notes (Signed)
Central Line placement verified verbally with Dr.Belfi

## 2016-09-02 NOTE — ED Notes (Signed)
CVP zeroed at this time; will continue to monitor

## 2016-09-02 NOTE — ED Notes (Signed)
Ice removed at this time; will continue to monitor

## 2016-09-03 ENCOUNTER — Other Ambulatory Visit (HOSPITAL_COMMUNITY): Payer: Self-pay

## 2016-09-03 LAB — POCT I-STAT 3, ART BLOOD GAS (G3+)
Acid-base deficit: 15 mmol/L — ABNORMAL HIGH (ref 0.0–2.0)
Bicarbonate: 11.7 mmol/L — ABNORMAL LOW (ref 20.0–28.0)
O2 Saturation: 90 %
PCO2 ART: 20.9 mmHg — AB (ref 32.0–48.0)
PH ART: 7.317 — AB (ref 7.350–7.450)
TCO2: 13 mmol/L — ABNORMAL LOW (ref 22–32)
pO2, Arterial: 42 mmHg — ABNORMAL LOW (ref 83.0–108.0)

## 2016-09-03 LAB — TROPONIN I: TROPONIN I: 35 ng/mL — AB (ref <0.03)

## 2016-09-03 LAB — BASIC METABOLIC PANEL
ANION GAP: 12 (ref 5–15)
BUN: 16 mg/dL (ref 6–20)
CALCIUM: 6.5 mg/dL — AB (ref 8.9–10.3)
CO2: 11 mmol/L — ABNORMAL LOW (ref 22–32)
Chloride: 110 mmol/L (ref 101–111)
Creatinine, Ser: 1.59 mg/dL — ABNORMAL HIGH (ref 0.61–1.24)
GFR calc Af Amer: 48 mL/min — ABNORMAL LOW (ref >60)
GFR, EST NON AFRICAN AMERICAN: 41 mL/min — AB (ref >60)
GLUCOSE: 342 mg/dL — AB (ref 65–99)
Potassium: 4.7 mmol/L (ref 3.5–5.1)
Sodium: 133 mmol/L — ABNORMAL LOW (ref 135–145)

## 2016-09-03 LAB — POCT I-STAT, CHEM 8
BUN: 16 mg/dL (ref 6–20)
CALCIUM ION: 0.96 mmol/L — AB (ref 1.15–1.40)
Chloride: 105 mmol/L (ref 101–111)
Creatinine, Ser: 1.2 mg/dL (ref 0.61–1.24)
Glucose, Bld: 367 mg/dL — ABNORMAL HIGH (ref 65–99)
HEMATOCRIT: 23 % — AB (ref 39.0–52.0)
HEMOGLOBIN: 7.8 g/dL — AB (ref 13.0–17.0)
Potassium: 3.6 mmol/L (ref 3.5–5.1)
SODIUM: 138 mmol/L (ref 135–145)
TCO2: 18 mmol/L — AB (ref 22–32)

## 2016-09-03 LAB — GLUCOSE, CAPILLARY
GLUCOSE-CAPILLARY: 332 mg/dL — AB (ref 65–99)
GLUCOSE-CAPILLARY: 349 mg/dL — AB (ref 65–99)
GLUCOSE-CAPILLARY: 356 mg/dL — AB (ref 65–99)
GLUCOSE-CAPILLARY: 375 mg/dL — AB (ref 65–99)
Glucose-Capillary: 318 mg/dL — ABNORMAL HIGH (ref 65–99)
Glucose-Capillary: 176 mg/dL — ABNORMAL HIGH (ref 65–99)

## 2016-09-03 LAB — CBC
HCT: 27.1 % — ABNORMAL LOW (ref 39.0–52.0)
Hemoglobin: 8.7 g/dL — ABNORMAL LOW (ref 13.0–17.0)
MCH: 37.8 pg — ABNORMAL HIGH (ref 26.0–34.0)
MCHC: 32.1 g/dL (ref 30.0–36.0)
MCV: 117.8 fL — AB (ref 78.0–100.0)
PLATELETS: 301 10*3/uL (ref 150–400)
RBC: 2.3 MIL/uL — ABNORMAL LOW (ref 4.22–5.81)
RDW: 14 % (ref 11.5–15.5)
WBC: 6.2 10*3/uL (ref 4.0–10.5)

## 2016-09-03 LAB — MAGNESIUM: Magnesium: 2.5 mg/dL — ABNORMAL HIGH (ref 1.7–2.4)

## 2016-09-03 LAB — MRSA PCR SCREENING: MRSA BY PCR: POSITIVE — AB

## 2016-09-03 LAB — PHOSPHORUS: Phosphorus: 5.1 mg/dL — ABNORMAL HIGH (ref 2.5–4.6)

## 2016-09-03 MED ORDER — SODIUM CHLORIDE 0.9 % IV SOLN
2.0000 g | Freq: Once | INTRAVENOUS | Status: AC
  Administered 2016-09-03: 2 g via INTRAVENOUS
  Filled 2016-09-03: qty 20

## 2016-09-03 MED ORDER — ORAL CARE MOUTH RINSE
15.0000 mL | OROMUCOSAL | Status: DC
  Administered 2016-09-03 (×3): 15 mL via OROMUCOSAL

## 2016-09-03 MED ORDER — VANCOMYCIN HCL IN DEXTROSE 750-5 MG/150ML-% IV SOLN
750.0000 mg | Freq: Two times a day (BID) | INTRAVENOUS | Status: DC
  Filled 2016-09-03 (×2): qty 150

## 2016-09-03 MED ORDER — VANCOMYCIN HCL IN DEXTROSE 750-5 MG/150ML-% IV SOLN
750.0000 mg | Freq: Two times a day (BID) | INTRAVENOUS | Status: DC
  Filled 2016-09-03: qty 150

## 2016-09-03 MED ORDER — SODIUM BICARBONATE 8.4 % IV SOLN
INTRAVENOUS | Status: AC
  Filled 2016-09-03: qty 50

## 2016-09-03 MED ORDER — CALCIUM CHLORIDE 10 % IV SOLN
INTRAVENOUS | Status: AC
  Filled 2016-09-03: qty 10

## 2016-09-03 MED ORDER — CHLORHEXIDINE GLUCONATE 0.12% ORAL RINSE (MEDLINE KIT)
15.0000 mL | Freq: Two times a day (BID) | OROMUCOSAL | Status: DC
  Administered 2016-09-03: 15 mL via OROMUCOSAL

## 2016-09-03 MED ORDER — INSULIN REGULAR HUMAN 100 UNIT/ML IJ SOLN
INTRAMUSCULAR | Status: DC
  Administered 2016-09-03: 3 [IU]/h via INTRAVENOUS
  Filled 2016-09-03: qty 1

## 2016-09-03 MED ORDER — SODIUM BICARBONATE 8.4 % IV SOLN
50.0000 meq | Freq: Once | INTRAVENOUS | Status: AC
  Administered 2016-09-03: 50 meq via INTRAVENOUS

## 2016-09-03 MED ORDER — IPRATROPIUM-ALBUTEROL 0.5-2.5 (3) MG/3ML IN SOLN: 3.0000 mL | RESPIRATORY_TRACT | Status: DC | PRN

## 2016-09-03 MED ORDER — PIPERACILLIN-TAZOBACTAM 3.375 G IVPB
3.3750 g | Freq: Three times a day (TID) | INTRAVENOUS | Status: DC
  Administered 2016-09-03: 3.375 g via INTRAVENOUS
  Filled 2016-09-03 (×3): qty 50

## 2016-09-03 MED FILL — Medication: Qty: 1 | Status: AC

## 2016-09-04 ENCOUNTER — Encounter: Payer: Self-pay | Admitting: Oncology

## 2016-09-07 LAB — CULTURE, BLOOD (ROUTINE X 2)
CULTURE: NO GROWTH
Special Requests: ADEQUATE

## 2016-09-08 LAB — CULTURE, BLOOD (ROUTINE X 2)
CULTURE: NO GROWTH
Special Requests: ADEQUATE

## 2016-09-09 NOTE — Progress Notes (Signed)
eLink Physician-Brief Progress Note Patient Name: [name] DOB: 29-Jun-1945 MRN: [id number]   Date of Service  Oct 02, 2016  HPI/Events of Note  Patient s/p prolonged code on hypothermia protocol, very unstable on multiple/max pressor support.  Code status discussed with family at bedside by Dr. Hanley Hays.  He informed me that family wanted to discuss these issues on their own.  Bedside nurse reports that family wish the patient to have no compressions but everything else related to a code event.  Additional family to arrive and further decisions related to code status to be considered at that time.  eICU Interventions  Plan: Code status changed from full to limited code.  No chest compressions.     Intervention Category Major Interventions: End of life / care limitation discussion  [place] 10-02-16, 2:04 AM

## 2016-09-09 NOTE — Progress Notes (Signed)
Vent Changes made via MD at bedside.

## 2016-09-09 NOTE — Progress Notes (Signed)
eLink Physician-Brief Progress Note Patient Name: [name] DOB: [birth year] MRN: [id number]   Date of Service  2016/09/17  HPI/Events of Note  Phone discussion with Jodi Mourning, patient's daughter and next of kin.  Daughter apprized of patient's critical condition and that every intervention is currently being attempted to maintain the patient with no discernable success.  She and the other family members are at the point to make the patient a full DNR with no escalation of care.   eICU Interventions  Plan: DNR in place with plans for no escalation of care Further steps toward comfort care per rounding team in AM if patient survives the night.     Intervention Category Major Interventions: End of life / care limitation discussion  [place] 2016/09/17, 4:06 AM

## 2016-09-09 NOTE — Progress Notes (Signed)
Pt asystole on monitor at 0703 two RNs, Lorrin Jackson and [employer] auscultated no heart tones or lung sounds for 2 minutes. Family at bedside and aware. MD notified. CDS notified.

## 2016-09-09 NOTE — Progress Notes (Signed)
Pharmacy Antibiotic Note  [name] is a 71 y.o. male admitted on 08/22/2016 s/p cardiac arrest, also with concern for sepsis.  Pharmacy has been consulted for vancomycin and zosyn dosing.  Patient received vancomycin 1g IV x1, as well as zosyn 3.375g IV x1 in the ED.   Patient is currently being cooled per hypothermia protocol and has normal WBC. LA is elevated at 14, although likely related to arrest. SCr is 1.1 for estimated CrCl ~ 60-65 mL/min, although expect this will worsen s/p cardiac arrest.   Plan: Vancomycin 750 mg IV q12hr Zosyn 3.375g IV q8hr Vancomycin trough at [facility] and as needed (goal 15-20 mcg/mL) Monitor renal function, clinical picture, and culture data  Height: 6' (182.9 cm) Weight: 169 lb 12.1 oz (77 kg) IBW/kg (Calculated) : 77.6  Temp (24hrs), Avg:88.8 F (31.6 C), Min:85.3 F (29.6 C), Max:91.9 F (33.3 C)   Recent Labs Lab 08/26/2016 1628 08/09/2016 1645 08/16/2016 2211 09/08/2016 2325  WBC 7.7  --  7.6  --   CREATININE 1.53* 1.30*  --  1.10  LATICACIDVEN  --  14.08*  --   --     Estimated Creatinine Clearance: 65.1 mL/min (by C-G formula based on SCr of 1.1 mg/dL).    Allergies  Allergen Reactions  . Aspirin   . Ibuprofen   . Naproxen Sodium     Antimicrobials this admission: 8/25 Vanc >>  8/25 Zosyn >>   Microbiology results: pending   [name], PharmD Clinical Pharmacist 2016-09-20 1:43 AM

## 2016-09-09 NOTE — Progress Notes (Signed)
Spoke with patients daughter, Jodi Mourning, patient's next of kin regarding the turning down of vasopressor gtts. Daughter had previous discussion of this with Dr. Derrek Gu at bedside about this decision. This RN discussed with Dr. Jimmy Footman regarding the decrease in vasopressor medication. Will reduce rate of medication based on family and MD consensus ,and will continue to monitor pt.

## 2016-09-09 DEATH — deceased

## 2016-09-13 ENCOUNTER — Telehealth: Payer: Self-pay

## 2016-09-13 NOTE — Telephone Encounter (Signed)
On 09/13/2016 I received a death certificate from [facility] (original). The death certificate is for burial. The patient is a patient of Doctor Crim. The death certificate will be taken to [facility] to see if Doctor Halford Chessman can sign the d/c.  On 10-07-2016 I received the death certificate back from Doctor [facility]. I got the death certificate ready and called the funeral home to let them know I mailed the d/c to vital records per the funeral home request.

## 2016-11-09 NOTE — Death Summary Note (Signed)
[name] was a 71 y.o. male former smoker admitted on 08/10/2016 with cardiac arrest.  He was found slumped in chair at home by family.  He had about 30 to 40 minutes before ROSC.  He was noted to have left pneumothorax likely from CPR.  He had chest tube placed.  He was intubated.  He was started on pressors.  He was started on target temperature management.  His medical status continued to deteriorate.  Family opted for DNR status with no escalation of care.  He expired on 09-18-16 at 38 AM.  Final diagnoses: PEA cardiac arrest Ventricular fibrillation Coronary artery disease Cardiogenic shock Acute hypoxic, hypercapnic respiratory failure Left pneumothorax after CPR Hx of laryngeal cancer Hx of lung cancer Hx of colon cancer COPD Acute anoxic encephalopathy  Chesley Mires, MD [facility]/Critical Care 10/17/2016, 2:34 PM
